# Patient Record
Sex: Female | Born: 1975 | Race: Black or African American | Hispanic: No | Marital: Single | Smoking: Light tobacco smoker
Health system: Southern US, Community
[De-identification: ages and names within clinical notes are randomized; demographics above are authoritative.]

## PROBLEM LIST (undated history)

## (undated) DIAGNOSIS — M1990 Unspecified osteoarthritis, unspecified site: Secondary | ICD-10-CM

## (undated) DIAGNOSIS — K219 Gastro-esophageal reflux disease without esophagitis: Secondary | ICD-10-CM

## (undated) DIAGNOSIS — I10 Essential (primary) hypertension: Secondary | ICD-10-CM

## (undated) DIAGNOSIS — F32A Depression, unspecified: Secondary | ICD-10-CM

## (undated) DIAGNOSIS — F419 Anxiety disorder, unspecified: Secondary | ICD-10-CM

## (undated) HISTORY — PX: CHOLECYSTECTOMY: SHX55

---

## 2019-05-16 ENCOUNTER — Other Ambulatory Visit: Payer: Self-pay

## 2019-05-16 ENCOUNTER — Ambulatory Visit (HOSPITAL_COMMUNITY)
Admission: EM | Admit: 2019-05-16 | Discharge: 2019-05-16 | Disposition: A | Discharge status: Home / Self Care | Payer: Medicaid Other | Attending: Internal Medicine | Admitting: Internal Medicine

## 2019-05-16 ENCOUNTER — Encounter (HOSPITAL_COMMUNITY): Payer: Self-pay | Admitting: Emergency Medicine

## 2019-05-16 ENCOUNTER — Ambulatory Visit (INDEPENDENT_AMBULATORY_CARE_PROVIDER_SITE_OTHER): Payer: Medicaid Other

## 2019-05-16 DIAGNOSIS — Z20828 Contact with and (suspected) exposure to other viral communicable diseases: Secondary | ICD-10-CM | POA: Diagnosis present

## 2019-05-16 DIAGNOSIS — R0602 Shortness of breath: Secondary | ICD-10-CM

## 2019-05-16 DIAGNOSIS — R110 Nausea: Secondary | ICD-10-CM

## 2019-05-16 DIAGNOSIS — R519 Headache, unspecified: Secondary | ICD-10-CM | POA: Diagnosis not present

## 2019-05-16 DIAGNOSIS — J029 Acute pharyngitis, unspecified: Secondary | ICD-10-CM

## 2019-05-16 DIAGNOSIS — Z20822 Contact with and (suspected) exposure to covid-19: Secondary | ICD-10-CM

## 2019-05-16 MED ORDER — ONDANSETRON HCL 4 MG PO TABS: 4.0000 mg | ORAL_TABLET | Freq: Four times a day (QID) | ORAL | 0 refills | Status: DC

## 2019-05-16 MED ORDER — BENZONATATE 100 MG PO CAPS: 100.0000 mg | ORAL_CAPSULE | Freq: Three times a day (TID) | ORAL | 0 refills | Status: DC

## 2019-05-16 MED ORDER — CETIRIZINE-PSEUDOEPHEDRINE ER 5-120 MG PO TB12: 1.0000 | ORAL_TABLET | Freq: Every day | ORAL | 0 refills | Status: DC

## 2019-05-16 MED ORDER — ALBUTEROL SULFATE HFA 108 (90 BASE) MCG/ACT IN AERS: 1.0000 | INHALATION_SPRAY | Freq: Four times a day (QID) | RESPIRATORY_TRACT | 0 refills | Status: DC | PRN

## 2019-05-16 NOTE — ED Triage Notes (Signed)
Has had 2 COVID tests in 2 days, both negative. (At [facility], had a rapid 30 minutes ago at work)   PT reports sore throat, SOB, vomiting, cough for 2 days.  Fatigued, feels "horrible"

## 2019-05-16 NOTE — ED Provider Notes (Signed)
[facility]    CSN: [id number] Arrival date & time: 05/16/19  1527      History   Chief Complaint Chief Complaint  Patient presents with  . Sore Throat  . Emesis    HPI Gina Patel is a 43 y.o. female.   The history is provided by the patient. No language interpreter was used.  Sore Throat This is a new problem. The current episode started 2 days ago. The problem occurs constantly. The problem has been gradually worsening. Associated symptoms include headaches and shortness of breath. Pertinent negatives include no chest pain and no abdominal pain. Nothing aggravates the symptoms. The symptoms are relieved by NSAIDs. She has tried acetaminophen for the symptoms. The treatment provided no relief.  Emesis Severity:  Mild Timing:  Rare Quality:  Unable to specify Able to tolerate:  Liquids How soon after eating does vomiting occur:  15 minutes Chronicity:  New Recent urination:  Normal Relieved by:  Nothing Worsened by:  Nothing Associated symptoms: cough, headaches and sore throat   Associated symptoms: no abdominal pain, no chills and no fever     History reviewed. No pertinent past medical history.  There are no active problems to display for this patient.   Past Surgical History:  Procedure Laterality Date  . CHOLECYSTECTOMY      OB History   No obstetric history on file.      Home Medications    Prior to Admission medications   Medication Sig Start Date End Date Taking? Authorizing Provider  albuterol (VENTOLIN HFA) 108 (90 Base) MCG/ACT inhaler Inhale 1-2 puffs into the lungs every 6 (six) hours as needed for wheezing or shortness of breath. 05/16/19   Audianna Landgren, Zachery DakinsKomlanvi S, FNP  benzonatate (TESSALON) 100 MG capsule Take 1 capsule (100 mg total) by mouth every 8 (eight) hours. 05/16/19   Scheryl Sanborn, Zachery DakinsKomlanvi S, FNP  cetirizine-pseudoephedrine (ZYRTEC-D) 5-120 MG tablet Take 1 tablet by mouth daily. 05/16/19   Haward Pope, Zachery DakinsKomlanvi S, FNP   ondansetron (ZOFRAN) 4 MG tablet Take 1 tablet (4 mg total) by mouth every 6 (six) hours. 05/16/19   AvegnoZachery Dakins, Chenita Ruda S, FNP    Family History No family history on file.  Social History Social History   Tobacco Use  . Smoking status: Not on file  Substance Use Topics  . Alcohol use: Not on file  . Drug use: Not on file     Allergies   Patient has no known allergies.   Review of Systems Review of Systems  Constitutional: Negative for activity change, appetite change, chills, fatigue and fever.  HENT: Positive for sinus pressure, sinus pain and sore throat.   Respiratory: Positive for cough and shortness of breath.   Cardiovascular: Negative for chest pain.  Gastrointestinal: Positive for vomiting. Negative for abdominal pain and nausea.  Neurological: Positive for headaches. Negative for dizziness.  ROS: all other are negatives   Physical Exam Triage Vital Signs ED Triage Vitals  Enc Vitals Group     BP 05/16/19 1603 (!) 150/106     Pulse Rate 05/16/19 1557 90     Resp 05/16/19 1557 20     Temp 05/16/19 1557 98.8 F (37.1 C)     Temp Source 05/16/19 1557 Oral     SpO2 05/16/19 1557 98 %     Weight --      Height --      Head Circumference --      Peak Flow --      Pain  Score 05/16/19 1555 5     Pain Loc --      Pain Edu? --      Excl. in [place]? --    No data found.  Updated Vital Signs BP (!) 150/106   Pulse 90   Temp 98.8 F (37.1 C) (Oral)   Resp 20   LMP 04/25/2019   SpO2 98%   Visual Acuity Right Eye Distance:   Left Eye Distance:   Bilateral Distance:    Right Eye Near:   Left Eye Near:    Bilateral Near:     Physical Exam Vitals signs and nursing note reviewed.  Constitutional:      General: She is not in acute distress.    Appearance: She is well-developed. She is obese. She is not ill-appearing or toxic-appearing.  HENT:     Head: Normocephalic.     Right Ear: Tympanic membrane, ear canal and external ear normal.     Left Ear:  Tympanic membrane, ear canal and external ear normal.     Nose: No congestion.     Right Sinus: Maxillary sinus tenderness present.     Left Sinus: Maxillary sinus tenderness present.     Mouth/Throat:     Mouth: Mucous membranes are moist.     Pharynx: Uvula midline. No oropharyngeal exudate.     Tonsils: No tonsillar exudate or tonsillar abscesses. 2+ on the right. 2+ on the left.  Cardiovascular:     Rate and Rhythm: Normal rate and regular rhythm.     Pulses: Normal pulses.     Heart sounds: Normal heart sounds. No murmur.  Pulmonary:     Effort: Pulmonary effort is normal. No respiratory distress.     Breath sounds: Normal breath sounds. No wheezing or rhonchi.  Chest:     Chest wall: No tenderness.  Abdominal:     General: Abdomen is flat. Bowel sounds are normal.     Palpations: Abdomen is soft.  Neurological:     Mental Status: She is alert and oriented to person, place, and time.      UC Treatments / Results  Labs (all labs ordered are listed, but only abnormal results are displayed) Labs Reviewed  NOVEL CORONAVIRUS, NAA (HOSP ORDER, SEND-OUT TO REF LAB; TAT 18-24 HRS)    EKG   Radiology Dg Chest 2 View  Result Date: 05/16/2019 CLINICAL DATA:  Cough, shortness of breath EXAM: CHEST - 2 VIEW COMPARISON:  None. FINDINGS: The heart size and mediastinal contours are within normal limits. Both lungs are clear. No pleural effusion. The visualized skeletal structures are unremarkable. IMPRESSION: No acute process in the chest. Electronically Signed   By: Macy Mis M.D.   On: 05/16/2019 16:44    Procedures Procedures (including critical care time)  Medications Ordered in UC Medications - No data to display  Initial Impression / Assessment and Plan / UC Course  I have reviewed the triage vital signs and the nursing notes.  Pertinent labs & imaging results that were available during my care of the patient were reviewed by me and considered in my medical  decision making (see chart for details).    COVID-19 test was ordered for this patient as her symptom arte most consistent with COVID. Chest X ray was negative for cardiopulmonary disease. Zofran, Albuterol, tessalon and zyrtec were prescribed and patient was advised to follow up if symptom get worse or go to ED Final Clinical Impressions(s) / UC Diagnoses   Final diagnoses:  Suspected  COVID-19 virus infection     Discharge Instructions     COVID testing ordered.  It will take between 2-7 days for test results.  Someone will contact you regarding abnormal results.    In the meantime: You should remain isolated in your home for 10 days from symptom onset AND greater than 72 hours after symptoms resolution (absence of fever without the use of fever-reducing medication and improvement in respiratory symptoms), whichever is longer Get plenty of rest and push fluids Tessalon Perles prescribed for cough Zyrtec-D prescribed for nasal congestion, runny nose, and/or sore throat Flonase prescribed for nasal congestion and runny nose Use medications daily for symptom relief Use OTC medications like ibuprofen or tylenol as needed fever or pain Call or go to the ED if you have any new or worsening symptoms such as fever, worsening cough, shortness of breath, chest tightness, chest pain, turning blue, changes in mental status, etc...    ED Prescriptions    Medication Sig Dispense Auth. Provider   albuterol (VENTOLIN HFA) 108 (90 Base) MCG/ACT inhaler Inhale 1-2 puffs into the lungs every 6 (six) hours as needed for wheezing or shortness of breath. 8 g Dwanda Tufano, Zachery Dakins, FNP   benzonatate (TESSALON) 100 MG capsule Take 1 capsule (100 mg total) by mouth every 8 (eight) hours. 21 capsule Lesieli Bresee S, FNP   cetirizine-pseudoephedrine (ZYRTEC-D) 5-120 MG tablet Take 1 tablet by mouth daily. 30 tablet Kiandre Spagnolo S, FNP   ondansetron (ZOFRAN) 4 MG tablet Take 1 tablet (4 mg total) by mouth  every 6 (six) hours. 12 tablet Anabell Swint, Zachery Dakins, FNP     PDMP not reviewed this encounter.   Durward Parcel, FNP 05/16/19 1746

## 2019-05-16 NOTE — Discharge Instructions (Addendum)
Your Chest X-ray show no cardioabnormalities  COVID testing ordered.  It will take between 2-7 days for test results.  Someone will contact you regarding abnormal results.    In the meantime: You should remain isolated in your home for 10 days from symptom onset AND greater than 72 hours after symptoms resolution (absence of fever without the use of fever-reducing medication and improvement in respiratory symptoms), whichever is longer Get plenty of rest and push fluids Tessalon Perles prescribed for cough Zyrtec-D prescribed for nasal congestion, runny nose, and/or sore throat Flonase prescribed for nasal congestion and runny nose Use medications daily for symptom relief Use OTC medications like ibuprofen or tylenol as needed fever or pain Call or go to the ED if you have any new or worsening symptoms such as fever, worsening cough, shortness of breath, chest tightness, chest pain, turning blue, changes in mental status, etc..Marland Kitchen

## 2019-05-18 LAB — NOVEL CORONAVIRUS, NAA (HOSP ORDER, SEND-OUT TO REF LAB; TAT 18-24 HRS): SARS-CoV-2, NAA: NOT DETECTED

## 2019-05-21 ENCOUNTER — Telehealth (HOSPITAL_COMMUNITY): Payer: Self-pay | Admitting: Emergency Medicine

## 2019-05-21 NOTE — Telephone Encounter (Signed)
Pt called requesting covid test results, results reported as negative.  

## 2019-06-12 ENCOUNTER — Other Ambulatory Visit: Payer: Self-pay

## 2019-06-12 ENCOUNTER — Emergency Department (HOSPITAL_COMMUNITY)
Admission: EM | Admit: 2019-06-12 | Discharge: 2019-06-12 | Disposition: A | Discharge status: Home / Self Care | Payer: Worker's Compensation | Attending: Emergency Medicine | Admitting: Emergency Medicine

## 2019-06-12 ENCOUNTER — Encounter (HOSPITAL_COMMUNITY): Payer: Self-pay | Admitting: Emergency Medicine

## 2019-06-12 DIAGNOSIS — Z3202 Encounter for pregnancy test, result negative: Secondary | ICD-10-CM | POA: Diagnosis not present

## 2019-06-12 DIAGNOSIS — Y9289 Other specified places as the place of occurrence of the external cause: Secondary | ICD-10-CM | POA: Insufficient documentation

## 2019-06-12 DIAGNOSIS — Y990 Civilian activity done for income or pay: Secondary | ICD-10-CM | POA: Insufficient documentation

## 2019-06-12 DIAGNOSIS — Z79899 Other long term (current) drug therapy: Secondary | ICD-10-CM | POA: Insufficient documentation

## 2019-06-12 DIAGNOSIS — X500XXA Overexertion from strenuous movement or load, initial encounter: Secondary | ICD-10-CM | POA: Insufficient documentation

## 2019-06-12 DIAGNOSIS — S3992XA Unspecified injury of lower back, initial encounter: Secondary | ICD-10-CM | POA: Diagnosis present

## 2019-06-12 DIAGNOSIS — S39012A Strain of muscle, fascia and tendon of lower back, initial encounter: Secondary | ICD-10-CM | POA: Diagnosis not present

## 2019-06-12 DIAGNOSIS — Y9389 Activity, other specified: Secondary | ICD-10-CM | POA: Insufficient documentation

## 2019-06-12 LAB — POC URINE PREG, ED: Preg Test, Ur: NEGATIVE

## 2019-06-12 MED ORDER — METHYLPREDNISOLONE 4 MG PO TBPK: ORAL_TABLET | ORAL | 0 refills | Status: DC

## 2019-06-12 MED ORDER — OXYCODONE-ACETAMINOPHEN 5-325 MG PO TABS
1.0000 | ORAL_TABLET | ORAL | Status: DC | PRN
  Administered 2019-06-12: 1 via ORAL
  Filled 2019-06-12: qty 1

## 2019-06-12 MED ORDER — CYCLOBENZAPRINE HCL 10 MG PO TABS: 10.0000 mg | ORAL_TABLET | Freq: Two times a day (BID) | ORAL | 0 refills | Status: DC | PRN

## 2019-06-12 NOTE — ED Provider Notes (Signed)
[facility] Provider Note   CSN: [id number] Arrival date & time: 06/12/19  0133     History Chief Complaint  Patient presents with  . Back Pain    Carolan Avedisian is a 43 y.o. female.  The history is provided by the patient.  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 No pertinent past medical history.  There are no problems to display for this patient.   Past Surgical History:  Procedure Laterality Date  . CHOLECYSTECTOMY       OB History   No obstetric history on file.     No family history on file.  Social History   Tobacco Use  . Smoking status: Not on file  Substance Use Topics  . Alcohol use: Not on file  . Drug use: Not on file    Home Medications Prior to Admission medications   Medication Sig Start Date End Date Taking? Authorizing Provider  albuterol (VENTOLIN HFA) 108 (90 Base) MCG/ACT inhaler Inhale 1-2 puffs into the lungs every 6 (six) hours as needed for wheezing or shortness of breath. 05/16/19   Avegno, Darrelyn Hillock, FNP  benzonatate (TESSALON) 100 MG capsule Take 1 capsule (100 mg total) by mouth every 8 (eight) hours. 05/16/19   Avegno, Darrelyn Hillock, FNP  cetirizine-pseudoephedrine (ZYRTEC-D) 5-120 MG tablet Take 1 tablet by mouth  daily. 05/16/19   Avegno, Darrelyn Hillock, FNP  cyclobenzaprine (FLEXERIL) 10 MG tablet Take 1 tablet (10 mg total) by mouth 2 (two) times daily as needed for up to 20 doses for muscle spasms. 06/12/19   Cheyan Frees, DO  methylPREDNISolone (MEDROL DOSEPAK) 4 MG TBPK tablet Follow package insert 06/12/19   Lakiah Dhingra, DO  ondansetron (ZOFRAN) 4 MG tablet Take 1 tablet (4 mg total) by mouth every 6 (six) hours. 05/16/19   AvegnoDarrelyn Hillock, FNP    Allergies    Patient has no known allergies.  Review of Systems   Review of Systems  Constitutional: Negative for chills, fever and weight loss.  HENT: Negative for ear pain and sore throat.   Eyes: Negative for pain and visual disturbance.  Respiratory: Negative for cough and shortness of breath.   Cardiovascular: Negative for chest pain and palpitations.  Gastrointestinal: Negative for abdominal pain, bowel incontinence and vomiting.  Genitourinary: Negative for dysuria, hematuria and pelvic pain.  Musculoskeletal: Positive for back pain and gait problem. Negative for arthralgias.  Skin: Negative for color change and rash.  Neurological: Positive for tingling. Negative for seizures, syncope, weakness, numbness and headaches.  All other systems reviewed and are negative.   Physical Exam Updated Vital Signs  ED Triage Vitals  Enc Vitals Group     BP 06/12/19 0158 (!) 147/95     Pulse Rate 06/12/19 0158 93  Resp 06/12/19 0158 16     Temp 06/12/19 0158 99.3 F (37.4 C)     Temp Source 06/12/19 0158 Oral     SpO2 06/12/19 0158 99 %     Weight 06/12/19 0209 220 lb (99.8 kg)     Height 06/12/19 0209 5\' 1"  (1.549 m)     Head Circumference --      Peak Flow --      Pain Score 06/12/19 0208 10     Pain Loc --      Pain Edu? --      Excl. in [place]? --     Physical Exam Vitals and nursing note reviewed.  Constitutional:      General: She is not in acute distress.    Appearance: She is well-developed.  HENT:     Head: Normocephalic  and atraumatic.     Nose: Nose normal.     Mouth/Throat:     Mouth: Mucous membranes are moist.  Eyes:     Extraocular Movements: Extraocular movements intact.     Conjunctiva/sclera: Conjunctivae normal.     Pupils: Pupils are equal, round, and reactive to light.  Cardiovascular:     Rate and Rhythm: Normal rate and regular rhythm.     Pulses: Normal pulses.     Heart sounds: Normal heart sounds. No murmur.  Pulmonary:     Effort: Pulmonary effort is normal. No respiratory distress.     Breath sounds: Normal breath sounds.  Abdominal:     Palpations: Abdomen is soft.     Tenderness: There is no abdominal tenderness.  Musculoskeletal:        General: Tenderness present. Normal range of motion.     Cervical back: Neck supple.     Comments: No midline spinal tenderness, paraspinal lumbar muscle tenderness, tenderness in left gluteal area  Skin:    General: Skin is warm and dry.  Neurological:     General: No focal deficit present.     Mental Status: She is alert and oriented to person, place, and time.     Cranial Nerves: No cranial nerve deficit.     Sensory: No sensory deficit.     Motor: No weakness.     Coordination: Coordination normal.     Comments: Antalgic gait, 5+ out of 5 strength throughout, normal sensation     ED Results / Procedures / Treatments   Labs (all labs ordered are listed, but only abnormal results are displayed) Labs Reviewed  POC URINE PREG, ED    EKG None  Radiology No results found.  Procedures Procedures (including critical care time)  Medications Ordered in ED Medications  oxyCODONE-acetaminophen (PERCOCET/ROXICET) 5-325 MG per tablet 1 tablet (1 tablet Oral Given 06/12/19 0242)    ED Course  I have reviewed the triage vital signs and the nursing notes.  Pertinent labs & imaging results that were available during my care of the patient were reviewed by me and considered in my medical decision making (see chart for details).     MDM Rules/Calculators/A&P                      Ardelia MemsKimberly Efferson is a 43 year old female with history of hypertension who presents to the ED with low back pain.  Patient with normal vitals.  No fever.  No concern for cauda equina, epidural abscess.  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  History and physical is consistent with a muscle spasm.  Likely sciatica.  No midline spinal tenderness.  No red flag signs including fever, loss of bowel or bladder.  Patient given Norco.  She is able to ambulate with antalgic gait.  Will prescribe Motrin, Tylenol, Flexeril, Dosepak of steroids.  Recommend follow-up with primary care doctor.  May benefit from physical therapy.  Given return precautions.  This chart was dictated using voice recognition software.  Despite best efforts to proofread,  errors can occur which can change the documentation meaning.     Final Clinical Impression(s) / ED Diagnoses Final diagnoses:  Strain of lumbar region, initial encounter    Rx / DC Orders ED Discharge Orders         Ordered    methylPREDNISolone (MEDROL DOSEPAK) 4 MG TBPK tablet     06/12/19 0809    cyclobenzaprine (FLEXERIL) 10 MG tablet  2 times daily PRN     06/12/19 0809           Virgina Norfolk, DO 06/12/19 [phone]

## 2019-06-12 NOTE — ED Triage Notes (Signed)
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  Pt states pain is going down her left leg.

## 2019-06-12 NOTE — ED Notes (Signed)
Called in waiting room and pt did not answer.

## 2019-06-12 NOTE — Discharge Instructions (Addendum)
Take 800 mg of Motrin every 8 hours for the next 5 days, take 1000 mg of Tylenol 4 times a day for the next 5 days, take steroids as prescribed, use Flexeril as needed for muscle spasm but do not mix with alcohol or drugs or operate heavy machinery, follow-up with your primary care doctor

## 2019-06-18 ENCOUNTER — Other Ambulatory Visit: Payer: Self-pay

## 2019-06-18 ENCOUNTER — Other Ambulatory Visit: Payer: Self-pay | Admitting: Family Medicine

## 2019-06-18 ENCOUNTER — Ambulatory Visit: Payer: Self-pay

## 2019-06-18 DIAGNOSIS — M545 Low back pain, unspecified: Secondary | ICD-10-CM

## 2019-10-09 ENCOUNTER — Encounter (HOSPITAL_COMMUNITY): Payer: Self-pay

## 2019-10-09 ENCOUNTER — Other Ambulatory Visit: Payer: Self-pay

## 2019-10-09 ENCOUNTER — Ambulatory Visit (INDEPENDENT_AMBULATORY_CARE_PROVIDER_SITE_OTHER): Payer: Medicaid Other

## 2019-10-09 ENCOUNTER — Ambulatory Visit (HOSPITAL_COMMUNITY)
Admission: EM | Admit: 2019-10-09 | Discharge: 2019-10-09 | Disposition: A | Discharge status: Home / Self Care | Payer: Medicaid Other | Attending: Emergency Medicine | Admitting: Emergency Medicine

## 2019-10-09 DIAGNOSIS — F17210 Nicotine dependence, cigarettes, uncomplicated: Secondary | ICD-10-CM | POA: Insufficient documentation

## 2019-10-09 DIAGNOSIS — R0789 Other chest pain: Secondary | ICD-10-CM

## 2019-10-09 DIAGNOSIS — R0981 Nasal congestion: Secondary | ICD-10-CM | POA: Insufficient documentation

## 2019-10-09 DIAGNOSIS — Z20822 Contact with and (suspected) exposure to covid-19: Secondary | ICD-10-CM | POA: Diagnosis not present

## 2019-10-09 DIAGNOSIS — J069 Acute upper respiratory infection, unspecified: Secondary | ICD-10-CM | POA: Diagnosis present

## 2019-10-09 DIAGNOSIS — Z79899 Other long term (current) drug therapy: Secondary | ICD-10-CM | POA: Diagnosis not present

## 2019-10-09 DIAGNOSIS — R05 Cough: Secondary | ICD-10-CM | POA: Insufficient documentation

## 2019-10-09 DIAGNOSIS — H6121 Impacted cerumen, right ear: Secondary | ICD-10-CM | POA: Diagnosis not present

## 2019-10-09 DIAGNOSIS — H9201 Otalgia, right ear: Secondary | ICD-10-CM | POA: Diagnosis not present

## 2019-10-09 DIAGNOSIS — R519 Headache, unspecified: Secondary | ICD-10-CM | POA: Insufficient documentation

## 2019-10-09 MED ORDER — PROMETHAZINE-DM 6.25-15 MG/5ML PO SYRP: 5.0000 mL | ORAL_SOLUTION | Freq: Every evening | ORAL | 0 refills | Status: DC | PRN

## 2019-10-09 MED ORDER — BENZONATATE 100 MG PO CAPS: 100.0000 mg | ORAL_CAPSULE | Freq: Every evening | ORAL | 0 refills | Status: DC | PRN

## 2019-10-09 MED ORDER — PSEUDOEPHEDRINE HCL 60 MG PO TABS: 60.0000 mg | ORAL_TABLET | Freq: Three times a day (TID) | ORAL | 0 refills | Status: DC | PRN

## 2019-10-09 NOTE — Discharge Instructions (Addendum)

## 2019-10-09 NOTE — ED Triage Notes (Signed)
Pt presents with productive cough, nasal drainage, headache, sore throat, and bilateral ear pain X 4 days.

## 2019-10-09 NOTE — ED Provider Notes (Signed)
[facility]   MRN: [id number] DOB: 02/19/1976  Subjective:   Gina Patel is a 44 y.o. female presenting for 3-day history of acute onset moderate persistent worsening malaise.  Patient has used Aleve, over-the-counter allergy medicine yesterday only.  She is working on quitting smoking, is down to 2 cigarettes/day.  Denies history of asthma, COPD.  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  No current facility-administered medications for this encounter.  Current Outpatient Medications:  .  albuterol (VENTOLIN HFA) 108 (90 Base) MCG/ACT inhaler, Inhale 1-2 puffs into the lungs every 6 (six) hours as needed for wheezing or shortness of breath., Disp: 8 g, Rfl: 0 .  benzonatate (TESSALON) 100 MG capsule, Take 1 capsule (100 mg total) by mouth every 8 (eight) hours., Disp: 21 capsule, Rfl: 0 .  cetirizine-pseudoephedrine (ZYRTEC-D) 5-120 MG tablet, Take 1 tablet by mouth daily., Disp: 30 tablet, Rfl: 0 .  cyclobenzaprine (FLEXERIL) 10 MG tablet, Take 1 tablet (10 mg total) by mouth 2 (two) times daily as needed for up to 20 doses for muscle spasms., Disp: 20 tablet, Rfl: 0 .  methylPREDNISolone (MEDROL DOSEPAK) 4 MG TBPK tablet, Follow package insert, Disp: 21 each, Rfl: 0 .  ondansetron (ZOFRAN) 4 MG tablet, Take 1 tablet (4 mg total) by mouth every 6 (six) hours., Disp: 12 tablet, Rfl: 0   No Known Allergies  History reviewed. No pertinent past medical history.   Past Surgical History:  Procedure Laterality Date  . CHOLECYSTECTOMY      Family History  Family history unknown: Yes    Social History   Tobacco Use  . Smoking status: Light Tobacco Smoker    Types: Cigarettes  Substance Use Topics  . Alcohol use: Not on file  . Drug use: Not on file    Review of Systems  Constitutional: Positive for fever and malaise/fatigue.  HENT: Positive for congestion, ear discharge and ear pain. Negative for sinus pain and sore throat.   Eyes:  Negative for discharge and redness.  Respiratory: Positive for cough. Negative for hemoptysis, shortness of breath and wheezing.   Cardiovascular: Positive for chest pain.  Gastrointestinal: Negative for abdominal pain, diarrhea, nausea and vomiting.  Genitourinary: Negative for dysuria, flank pain and hematuria.  Musculoskeletal: Negative for myalgias.  Skin: Negative for rash.  Neurological: Positive for headaches. Negative for dizziness and weakness.  Psychiatric/Behavioral: Negative for depression and substance abuse.     Objective:   Vitals: BP 116/86 (BP Location: Right Arm)   Pulse 94   Temp 98 F (36.7 C) (Oral)   Resp 17   LMP 09/21/2019   SpO2 96%   Physical Exam Constitutional:      General: She is not in acute distress.    Appearance: Normal appearance. She is well-developed. She is ill-appearing. She is not toxic-appearing or diaphoretic.  HENT:     Head: Normocephalic and atraumatic.     Right Ear: Tympanic membrane and ear canal normal. No drainage or tenderness. No middle ear effusion. Tympanic membrane is not erythematous.     Left Ear: Tympanic membrane and ear canal normal. No drainage or tenderness.  No middle ear effusion. Tympanic membrane is not erythematous.     Ears:     Comments: Ceruminous right ear canal.    Nose: Congestion present. No rhinorrhea.     Mouth/Throat:     Mouth: Mucous membranes are moist. No oral lesions.     Pharynx: No pharyngeal swelling, oropharyngeal  exudate, posterior oropharyngeal erythema or uvula swelling.     Tonsils: No tonsillar exudate or tonsillar abscesses.  Eyes:     General: No scleral icterus.       Right eye: No discharge.        Left eye: No discharge.     Extraocular Movements: Extraocular movements intact.     Right eye: Normal extraocular motion.     Left eye: Normal extraocular motion.     Conjunctiva/sclera: Conjunctivae normal.     Pupils: Pupils are equal, round, and reactive to light.   Cardiovascular:     Rate and Rhythm: Normal rate and regular rhythm.     Pulses: Normal pulses.     Heart sounds: Normal heart sounds. No murmur. No friction rub. No gallop.   Pulmonary:     Effort: Pulmonary effort is normal. No respiratory distress.     Breath sounds: No stridor. No wheezing, rhonchi or rales.     Comments: Decreased lung sounds in mid to lower lung fields. Musculoskeletal:     Cervical back: Normal range of motion and neck supple.  Lymphadenopathy:     Cervical: No cervical adenopathy.  Skin:    General: Skin is warm and dry.     Findings: No rash.  Neurological:     General: No focal deficit present.     Mental Status: She is alert and oriented to person, place, and time.     Cranial Nerves: No cranial nerve deficit.     Motor: No weakness.     Coordination: Coordination normal.     Gait: Gait normal.     Deep Tendon Reflexes: Reflexes normal.  Psychiatric:        Mood and Affect: Mood normal.        Behavior: Behavior normal.        Thought Content: Thought content normal.        Judgment: Judgment normal.    DG Chest 2 View  Result Date: 10/09/2019 CLINICAL DATA:  Productive cough with chest pain EXAM: CHEST - 2 VIEW COMPARISON:  05/16/2019 FINDINGS: Cardiac shadow is within normal limits. The lungs are well aerated bilaterally. No focal infiltrate or sizable effusion is seen. No acute bony abnormality is noted. IMPRESSION: No active cardiopulmonary disease. Electronically Signed   By: Alcide Clever M.D.   On: 10/09/2019 15:48    Assessment and Plan :   1. Atypical chest pain   2. Viral URI with cough   3. Sinus congestion   4. Sinus headache   5. Right ear pain   6. Excessive cerumen in ear canal, right     Will manage for viral illness such as viral URI, viral syndrome, viral rhinitis, COVID-19. Counseled patient on nature of COVID-19 including modes of transmission, diagnostic testing, management and supportive care.  Offered symptomatic relief.  COVID 19 testing is pending. Counseled patient on potential for adverse effects with medications prescribed/recommended today, ER and return-to-clinic precautions discussed, patient verbalized understanding.     Wallis Bamberg, [place] 10/09/19 1612

## 2019-10-10 LAB — SARS CORONAVIRUS 2 (TAT 6-24 HRS): SARS Coronavirus 2: NEGATIVE

## 2020-04-30 ENCOUNTER — Emergency Department (HOSPITAL_COMMUNITY)
Admission: EM | Admit: 2020-04-30 | Discharge: 2020-05-01 | Disposition: A | Discharge status: Home / Self Care | Payer: Medicaid Other | Attending: Emergency Medicine | Admitting: Emergency Medicine

## 2020-04-30 ENCOUNTER — Other Ambulatory Visit: Payer: Self-pay

## 2020-04-30 ENCOUNTER — Encounter (HOSPITAL_COMMUNITY): Payer: Self-pay | Admitting: *Deleted

## 2020-04-30 DIAGNOSIS — F17210 Nicotine dependence, cigarettes, uncomplicated: Secondary | ICD-10-CM | POA: Diagnosis not present

## 2020-04-30 DIAGNOSIS — R519 Headache, unspecified: Secondary | ICD-10-CM | POA: Diagnosis present

## 2020-04-30 DIAGNOSIS — Z79899 Other long term (current) drug therapy: Secondary | ICD-10-CM | POA: Diagnosis not present

## 2020-04-30 DIAGNOSIS — I10 Essential (primary) hypertension: Secondary | ICD-10-CM | POA: Diagnosis not present

## 2020-04-30 HISTORY — DX: Essential (primary) hypertension: I10

## 2020-04-30 NOTE — ED Triage Notes (Signed)
Pt reports headache for 3 days. She took her bp and it was 170/111 tonight. She says she was taken off her bp medications several years ago. Taking tylenol without relief.

## 2020-05-01 ENCOUNTER — Emergency Department (HOSPITAL_COMMUNITY): Payer: Medicaid Other

## 2020-05-01 LAB — I-STAT BETA HCG BLOOD, ED (MC, WL, AP ONLY): I-stat hCG, quantitative: <5 m[IU]/mL (ref <5)

## 2020-05-01 MED ORDER — DIPHENHYDRAMINE HCL 12.5 MG/5ML PO ELIX
12.5000 mg | ORAL_SOLUTION | Freq: Once | ORAL | Status: AC
  Administered 2020-05-01: 12.5 mg via ORAL
  Filled 2020-05-01: qty 10

## 2020-05-01 MED ORDER — NAPROXEN 375 MG PO TABS: 375.0000 mg | ORAL_TABLET | Freq: Two times a day (BID) | ORAL | 0 refills | Status: DC

## 2020-05-01 MED ORDER — METOCLOPRAMIDE HCL 10 MG PO TABS
10.0000 mg | ORAL_TABLET | Freq: Once | ORAL | Status: AC
  Administered 2020-05-01: 10 mg via ORAL
  Filled 2020-05-01: qty 1

## 2020-05-01 MED ORDER — KETOROLAC TROMETHAMINE 60 MG/2ML IM SOLN
60.0000 mg | Freq: Once | INTRAMUSCULAR | Status: AC
  Administered 2020-05-01: 60 mg via INTRAMUSCULAR
  Filled 2020-05-01: qty 2

## 2020-05-01 MED ORDER — DIVALPROEX SODIUM 250 MG PO DR TAB
500.0000 mg | DELAYED_RELEASE_TABLET | Freq: Two times a day (BID) | ORAL | Status: DC
  Administered 2020-05-01: 500 mg via ORAL
  Filled 2020-05-01: qty 2

## 2020-05-01 NOTE — ED Provider Notes (Signed)
[facility] EMERGENCY DEPARTMENT Provider Note   CSN: [id number] Arrival date & time: 04/30/20  2238     History Chief Complaint  Patient presents with  . Headache    Gina Patel is a 44 y.o. female.  The history is provided by the patient.  Headache Pain location:  Frontal Quality:  Dull Radiates to:  Does not radiate Onset quality:  Gradual Timing:  Constant Chronicity:  New Context: not activity, not exposure to bright light, not caffeine, not coughing, not defecating, not eating, not stress, not exposure to cold air, not intercourse, not loud noise and not straining   Relieved by:  Nothing Worsened by:  Nothing Ineffective treatments:  Acetaminophen Associated symptoms: no abdominal pain, no back pain, no blurred vision, no congestion, no cough, no diarrhea, no dizziness, no drainage, no ear pain, no eye pain, no facial pain, no fatigue, no fever, no focal weakness, no hearing loss, no loss of balance, no myalgias, no nausea, no near-syncope, no neck pain, no neck stiffness, no numbness, no paresthesias, no photophobia, no seizures, no sinus pressure, no sore throat, no swollen glands, no syncope, no tingling, no URI, no visual change, no vomiting and no weakness   Risk factors: no anger   Patient with HTN not currently on medication presents with 3.5 days of frontal HA.  No f/c/r.  No changes in vision, speech or cognition.  No pain or loss of eye motion.,       Past Medical History:  Diagnosis Date  . Hypertension     There are no problems to display for this patient.   Past Surgical History:  Procedure Laterality Date  . CHOLECYSTECTOMY       OB History   No obstetric history on file.     Family History  Family history unknown: Yes    Social History   Tobacco Use  . Smoking status: Light Tobacco Smoker    Types: Cigarettes  Substance Use Topics  . Alcohol use: Not on file  . Drug use: Not on file    Home  Medications Prior to Admission medications   Medication Sig Start Date End Date Taking? Authorizing Provider  albuterol (VENTOLIN HFA) 108 (90 Base) MCG/ACT inhaler Inhale 1-2 puffs into the lungs every 6 (six) hours as needed for wheezing or shortness of breath. 05/16/19   Avegno, Zachery Dakins, FNP  benzonatate (TESSALON) 100 MG capsule Take 1-2 capsules (100-200 mg total) by mouth at bedtime as needed for cough. 10/09/19   Wallis Bamberg, PA-C  cetirizine-pseudoephedrine (ZYRTEC-D) 5-120 MG tablet Take 1 tablet by mouth daily. 05/16/19   Avegno, Zachery Dakins, FNP  cyclobenzaprine (FLEXERIL) 10 MG tablet Take 1 tablet (10 mg total) by mouth 2 (two) times daily as needed for up to 20 doses for muscle spasms. 06/12/19   Curatolo, Adam, DO  methylPREDNISolone (MEDROL DOSEPAK) 4 MG TBPK tablet Follow package insert 06/12/19   Curatolo, Adam, DO  ondansetron (ZOFRAN) 4 MG tablet Take 1 tablet (4 mg total) by mouth every 6 (six) hours. 05/16/19   Avegno, Zachery Dakins, FNP  promethazine-dextromethorphan (PROMETHAZINE-DM) 6.25-15 MG/5ML syrup Take 5 mLs by mouth at bedtime as needed for cough. 10/09/19   Wallis Bamberg, PA-C  pseudoephedrine (SUDAFED) 60 MG tablet Take 1 tablet (60 mg total) by mouth every 8 (eight) hours as needed for congestion. 10/09/19   Wallis Bamberg, PA-C    Allergies    Patient has no known allergies.  Review of Systems   Review  of Systems  Constitutional: Negative for fatigue and fever.  HENT: Negative for congestion, ear pain, hearing loss, postnasal drip, sinus pressure and sore throat.   Eyes: Negative for blurred vision, photophobia and pain.  Respiratory: Negative for cough.   Cardiovascular: Negative for syncope and near-syncope.  Gastrointestinal: Negative for abdominal pain, diarrhea, nausea and vomiting.  Genitourinary: Negative for difficulty urinating.  Musculoskeletal: Negative for back pain, myalgias, neck pain and neck stiffness.  Skin: Negative for rash.  Neurological:  Positive for headaches. Negative for dizziness, tremors, focal weakness, seizures, syncope, facial asymmetry, speech difficulty, weakness, numbness, paresthesias and loss of balance.  Psychiatric/Behavioral: Negative for agitation and confusion.  All other systems reviewed and are negative.   Physical Exam Updated Vital Signs BP (!) 143/92 (BP Location: Right Wrist)   Pulse 76   Temp 98.5 F (36.9 C) (Oral)   Resp 14   Ht 5\' 1"  (1.549 m)   Wt 105.2 kg   LMP 04/26/2020   SpO2 100%   BMI 43.84 kg/m   Physical Exam Vitals and nursing note reviewed.  Constitutional:      General: She is not in acute distress.    Appearance: Normal appearance.     Comments: Lights on resting comfortably, no non verbal signs of pain   HENT:     Head: Normocephalic and atraumatic.     Nose: Nose normal.     Mouth/Throat:     Mouth: Mucous membranes are moist.     Pharynx: Oropharynx is clear.  Eyes:     Extraocular Movements: Extraocular movements intact.     Conjunctiva/sclera: Conjunctivae normal.     Pupils: Pupils are equal, round, and reactive to light.     Comments: No proptosis disk margins sharp  Cardiovascular:     Rate and Rhythm: Normal rate and regular rhythm.     Pulses: Normal pulses.     Heart sounds: Normal heart sounds.  Pulmonary:     Effort: Pulmonary effort is normal.     Breath sounds: Normal breath sounds.  Abdominal:     General: Abdomen is flat. Bowel sounds are normal.     Palpations: Abdomen is soft.     Tenderness: There is no abdominal tenderness. There is no guarding.  Musculoskeletal:        General: Normal range of motion.     Cervical back: Normal range of motion and neck supple. No rigidity.  Lymphadenopathy:     Cervical: No cervical adenopathy.  Skin:    General: Skin is warm and dry.     Capillary Refill: Capillary refill takes less than 2 seconds.  Neurological:     General: No focal deficit present.     Mental Status: She is alert and oriented to  person, place, and time.     Cranial Nerves: No cranial nerve deficit.     Deep Tendon Reflexes: Reflexes normal.  Psychiatric:        Mood and Affect: Mood normal.        Behavior: Behavior normal.     ED Results / Procedures / Treatments   Labs (all labs ordered are listed, but only abnormal results are displayed) Labs Reviewed  I-STAT BETA HCG BLOOD, ED ([facility], [facility], [facility] ONLY)    EKG None  Radiology No results found.  Procedures Procedures (including critical care time)  Medications Ordered in ED Medications  ketorolac (TORADOL) injection 60 mg (has no administration in time range)  metoCLOPramide (REGLAN) tablet 10 mg (has no  administration in time range)  diphenhydrAMINE (BENADRYL) 12.5 MG/5ML elixir 12.5 mg (has no administration in time range)    ED Course  I have reviewed the triage vital signs and the nursing notes.  Pertinent labs & imaging results that were available during my care of the patient were reviewed by me and considered in my medical decision making (see chart for details).   BP normalizes without intervention.  Doubt intracranial infection no fevers no neck stiffess nor other infectious symptoms,  doubt ICH,  Well appearing no emesis.  Also doubt cavernous sinus thrombosis, disk are sharp, EOMI cognition is intact.  CT is unremarkable.    Gina Patel was evaluated in Emergency Department on 05/01/2020 for the symptoms described in the history of present illness. She was evaluated in the context of the global COVID-19 pandemic, which necessitated consideration that the patient might be at risk for infection with the SARS-CoV-2 virus that causes COVID-19. Institutional protocols and algorithms that pertain to the evaluation of patients at risk for COVID-19 are in a state of rapid change based on information released by regulatory bodies including the CDC and federal and state organizations. These policies and algorithms were followed during the patient's care  in the ED. ,  Final Clinical Impression(s) / ED Diagnoses Final diagnoses:  Headache disorder  Hypertension, unspecified type   Return for intractable cough, coughing up blood,fevers >100.4 unrelieved by medication, shortness of breath, intractable vomiting, chest pain, shortness of breath, weakness,numbness, changes in speech, facial asymmetry,abdominal pain, passing out,Inability to tolerate liquids or food, cough, altered mental status or any concerns. No signs of systemic illness or infection. The patient is nontoxic-appearing on exam and vital signs are within normal limits.   I have reviewed the triage vital signs and the nursing notes. Pertinent labs &imaging results that were available during my care of the patient were reviewed by me and considered in my medical decision making (see chart for details).After history, exam, and medical workup I feel the patient has beenappropriately medically screened and is safe for discharge home. Pertinent diagnoses were discussed with the patient. Patient was given return precautions.   Needham Biggins, MD 05/01/20 [phone]

## 2021-03-30 ENCOUNTER — Emergency Department (HOSPITAL_COMMUNITY)
Admission: EM | Admit: 2021-03-30 | Discharge: 2021-03-31 | Disposition: A | Discharge status: Home / Self Care | Payer: Medicaid Other | Attending: Emergency Medicine | Admitting: Emergency Medicine

## 2021-03-30 ENCOUNTER — Other Ambulatory Visit: Payer: Self-pay

## 2021-03-30 DIAGNOSIS — Z20822 Contact with and (suspected) exposure to covid-19: Secondary | ICD-10-CM | POA: Insufficient documentation

## 2021-03-30 DIAGNOSIS — J029 Acute pharyngitis, unspecified: Secondary | ICD-10-CM

## 2021-03-30 DIAGNOSIS — I10 Essential (primary) hypertension: Secondary | ICD-10-CM | POA: Insufficient documentation

## 2021-03-30 DIAGNOSIS — F17210 Nicotine dependence, cigarettes, uncomplicated: Secondary | ICD-10-CM | POA: Insufficient documentation

## 2021-03-30 DIAGNOSIS — H6690 Otitis media, unspecified, unspecified ear: Secondary | ICD-10-CM

## 2021-03-30 DIAGNOSIS — H6691 Otitis media, unspecified, right ear: Secondary | ICD-10-CM | POA: Insufficient documentation

## 2021-03-30 DIAGNOSIS — H9201 Otalgia, right ear: Secondary | ICD-10-CM | POA: Diagnosis present

## 2021-03-31 ENCOUNTER — Encounter (HOSPITAL_COMMUNITY): Payer: Self-pay

## 2021-03-31 LAB — GROUP A STREP BY PCR: Group A Strep by PCR: NOT DETECTED

## 2021-03-31 LAB — RESP PANEL BY RT-PCR (FLU A&B, COVID) ARPGX2
Influenza A by PCR: NEGATIVE
Influenza B by PCR: NEGATIVE
SARS Coronavirus 2 by RT PCR: NEGATIVE

## 2021-03-31 MED ORDER — AMOXICILLIN-POT CLAVULANATE 875-125 MG PO TABS
1.0000 | ORAL_TABLET | Freq: Once | ORAL | Status: AC
  Administered 2021-03-31: 1 via ORAL
  Filled 2021-03-31: qty 1

## 2021-03-31 MED ORDER — AMOXICILLIN-POT CLAVULANATE 875-125 MG PO TABS: 1.0000 | ORAL_TABLET | Freq: Two times a day (BID) | ORAL | 0 refills | Status: DC

## 2021-03-31 NOTE — ED Triage Notes (Signed)
Pt reports that she has had a sore throat for the past day with chills and ear pain.

## 2021-03-31 NOTE — ED Provider Notes (Signed)
[facility] EMERGENCY DEPARTMENT Provider Note   CSN: [id number] Arrival date & time: 03/30/21  2344     History Chief Complaint  Patient presents with   Sore Throat    Gina Patel is a 45 y.o. female history of hypertension here presenting with sore throat.  Patient has sore throat for the last day or so.  Patient has some right ear pain and subjective chills as well.  Patient states that she has some sharp pain in her throat as well.  Denies any sick contacts.  Has a history of hypertension.  The history is provided by the patient.      Past Medical History:  Diagnosis Date   Hypertension     There are no problems to display for this patient.   Past Surgical History:  Procedure Laterality Date   CHOLECYSTECTOMY       OB History   No obstetric history on file.     Family History  Family history unknown: Yes    Social History   Tobacco Use   Smoking status: Light Smoker    Types: Cigarettes    Home Medications Prior to Admission medications   Medication Sig Start Date End Date Taking? Authorizing Provider  amoxicillin-clavulanate (AUGMENTIN) 875-125 MG tablet Take 1 tablet by mouth every 12 (twelve) hours. 03/31/21  Yes Charlynne Pander, MD  acetaminophen (TYLENOL) 325 MG tablet Take 650 mg by mouth every 6 (six) hours as needed for headache.    [provider]  naproxen (NAPROSYN) 375 MG tablet Take 1 tablet (375 mg total) by mouth 2 (two) times daily. 05/01/20   Palumbo, April, MD    Allergies    Patient has no known allergies.  Review of Systems   Review of Systems  HENT:  Positive for sore throat.   All other systems reviewed and are negative.  Physical Exam Updated Vital Signs BP (!) 134/96 (BP Location: Right Arm)   Pulse 88   Temp 98.8 F (37.1 C) (Oral)   Resp 17   SpO2 99%   Physical Exam Vitals and nursing note reviewed.  Constitutional:      Comments: Slightly uncomfortable  HENT:     Head:  Normocephalic.     Ears:     Comments: Patient does have earwax in the right ear and appears to have a right otitis media.  Left TM normal.    Mouth/Throat:     Mouth: Mucous membranes are moist.     Comments: Patient has good dentition overall and no obvious periapical abscess.  Floor the mouth is nontender.  Patient has normal tonsils bilaterally. Eyes:     Conjunctiva/sclera: Conjunctivae normal.  Neck:     Comments: Patient has right cervical lymphadenopathy. Cardiovascular:     Rate and Rhythm: Normal rate and regular rhythm.  Pulmonary:     Effort: Pulmonary effort is normal.     Breath sounds: Normal breath sounds.  Abdominal:     General: Bowel sounds are normal.     Palpations: Abdomen is soft.  Skin:    General: Skin is warm.     Capillary Refill: Capillary refill takes less than 2 seconds.  Neurological:     General: No focal deficit present.     Mental Status: She is alert and oriented to person, place, and time.  Psychiatric:        Mood and Affect: Mood normal.        Behavior: Behavior normal.  ED Results / Procedures / Treatments   Labs (all labs ordered are listed, but only abnormal results are displayed) Labs Reviewed  GROUP A STREP BY PCR  RESP PANEL BY RT-PCR (FLU A&B, COVID) [id number]    EKG None  Radiology No results found.  Procedures Procedures   Medications Ordered in ED Medications  amoxicillin-clavulanate (AUGMENTIN) 875-125 MG per tablet 1 tablet (1 tablet Oral Given 03/31/21 0329)    ED Course  I have reviewed the triage vital signs and the nursing notes.  Pertinent labs & imaging results that were available during my care of the patient were reviewed by me and considered in my medical decision making (see chart for details).    MDM Rules/Calculators/A&P                           Gina Patel is a 45 y.o. female here with sore throat and ear pain.  Patient does have right otitis media.  Patient's strep and COVID tests are  negative. No obvious dental infection and floor of mouth is soft and she has no trismus.  Her right cervical lymphadenopathy is likely secondary to otitis media.  Will discharge patient home with Augmentin.  She was initially tachycardic that resolved in the ED.   Final Clinical Impression(s) / ED Diagnoses Final diagnoses:  Acute otitis media, unspecified otitis media type  Viral pharyngitis    Rx / DC Orders ED Discharge Orders          Ordered    amoxicillin-clavulanate (AUGMENTIN) 875-125 MG tablet  Every 12 hours        03/31/21 0333             Charlynne Pander, MD 03/31/21 [phone]

## 2021-03-31 NOTE — Discharge Instructions (Signed)
You have a right ear infection.   Take Augmentin twice daily for a week.  Stay hydrated.  Take Tylenol or Motrin for fever   Your COVID test and strep test is negative right now  Exam to ER if you have worse sore throat, ear pain, trouble swallowing

## 2021-03-31 NOTE — ED Provider Notes (Signed)
Emergency Medicine Provider Triage Evaluation Note  Lejla Moeser , a 45 y.o. female  was evaluated in triage.  Pt complains of sore throat for the past 24 hours, worse tonight.  Subjective fever/chills.  No sick contacts.  Review of Systems  Positive: Sore throat, chills Negative: vomiting  Physical Exam  BP 135/90   Pulse (!) 121   Temp 98.5 F (36.9 C) (Oral)   Resp 18   SpO2 98%  Gen:   Awake, no distress, actively chewing gum Resp:  Normal effort  MSK:   Moves extremities without difficulty  Other:  Tonsils overall normal in appearance bilaterally without exudate; uvula midline without evidence of peritonsillar abscess; handling secretions appropriately; no difficulty swallowing or speaking; normal phonation without stridor  Medical Decision Making  Medically screening exam initiated at 12:07 AM.  Appropriate orders placed.  Monika Chestang was informed that the remainder of the evaluation will be completed by another provider, this initial triage assessment does not replace that evaluation, and the importance of remaining in the ED until their evaluation is complete.  Sore throat.    Rapid strep and covid screen sent.   Garlon Hatchet, PA-C 03/31/21 0008    Charlynne Pander, MD 03/31/21 [phone]

## 2021-08-10 ENCOUNTER — Other Ambulatory Visit: Payer: Self-pay

## 2021-08-10 ENCOUNTER — Encounter (HOSPITAL_COMMUNITY): Payer: Self-pay | Admitting: Orthopedic Surgery

## 2021-08-10 NOTE — Progress Notes (Signed)
Anesthesia Review:  PCP: [facility]  Cardiologist : none  Chest x-ray : EKG : Echo : Stress test: Cardiac Cath :  Activity level: can do a flight of stairs without difficulty  Sleep Study/ CPAP : none  Fasting Blood Sugar :      / Checks Blood Sugar -- times a day:   Blood Thinner/ Instructions /Last Dose: ASA / Instructions/ Last Dose :

## 2021-08-12 ENCOUNTER — Ambulatory Visit: Payer: Self-pay | Admitting: Physician Assistant

## 2021-08-12 NOTE — H&P (View-Only) (Signed)
Gina Patel is an 46 y.o. female.   Chief Complaint: right knee pain HPI: She comes in for a follow-up from the Urgent Care.  She works at [employer].  No history of trauma. She I has an exquisitely painful knee. She has been given Ibuprofen. The problem started late in the year/ early this year. She has an antalgic gait. Twisting and turning cause  terrible pain in her knee. This  is affecting her ability to work to the point where they will put her out, time frame yet determined.  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    Past Medical History:  Diagnosis Date   Anxiety    Arthritis    Depression    GERD (gastroesophageal reflux disease)     Past Surgical History:  Procedure Laterality Date   CHOLECYSTECTOMY      Family History  Family history unknown: Yes   Social History:  reports that she has been smoking cigarettes. She does not have any smokeless tobacco history on file. She reports that she does not drink alcohol and does not use drugs.  Allergies: No Known Allergies  (Not in a hospital admission)   No results found for this or any previous visit (from the past 48 hour(s)). No results found.  Review of Systems  Musculoskeletal:  Positive for arthralgias.  Psychiatric/Behavioral:  The patient is nervous/anxious.   All other systems reviewed and are negative.  Last menstrual period 07/26/2021. Physical Exam Constitutional:      General: She is not in acute distress.    Appearance: Normal appearance.  HENT:     Head: Normocephalic and atraumatic.  Eyes:     Extraocular Movements: Extraocular movements intact.     Pupils: Pupils are equal, round, and reactive to light.  Cardiovascular:     Rate and Rhythm: Normal rate.     Pulses: Normal pulses.  Pulmonary:     Effort: Pulmonary effort is normal. No respiratory distress.  Abdominal:     General: Abdomen is flat. There is no  distension.     Palpations: Abdomen is soft.  Musculoskeletal:     Cervical back: Normal range of motion and neck supple.     Comments: Reproducibly tender over the lateral aspect of the patellofemoral joint with crepitus.   Skin:    General: Skin is warm and dry.     Findings: No erythema or rash.  Neurological:     General: No focal deficit present.     Mental Status: She is alert and oriented to person, place, and time.  Psychiatric:        Mood and Affect: Mood normal.        Behavior: Behavior normal.     Assessment/Plan She returns with absolutely no improvement with home exercises.  Exercises and quad strengthening aggravated her pain.  She has a relatively physical job.  She is on leave until April.  She did not respond at all to intraarticular corticosteroid injection or treatment with prescription anti-inflammatories.  Reproducibly tender over the lateral aspect of the patellofemoral joint with crepitus.  Repeat patellar films show some bias to the lateral side of the trochlear groove.  She does have full thickness cartilage loss in the lateral facet.  She is a young patient who is trying to work.  I think she is a candidate for arthroscopic evaluation of the right knee.  Risks and benefits discussed in detail.  She would  be post-op rehab for 4-6 weeks.  She already has a brace as well.  Postoperative aspirin for DVT prophylaxis.  We will see about getting that organized for her.  She will follow up to see me potentially after surgery.    Margart Sickles, PA-C 08/12/2021, 10:10 PM

## 2021-08-12 NOTE — H&P (Signed)
Gina Patel is an 46 y.o. female.   Chief Complaint: right knee pain HPI: She comes in for a follow-up from the Urgent Care.  She works at [employer].  No history of trauma. She I has an exquisitely painful knee. She has been given Ibuprofen. The problem started late in the year/ early this year. She has an antalgic gait. Twisting and turning cause  terrible pain in her knee. This  is affecting her ability to work to the point where they will put her out, time frame yet determined.  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    Past Medical History:  Diagnosis Date   Anxiety    Arthritis    Depression    GERD (gastroesophageal reflux disease)     Past Surgical History:  Procedure Laterality Date   CHOLECYSTECTOMY      Family History  Family history unknown: Yes   Social History:  reports that she has been smoking cigarettes. She does not have any smokeless tobacco history on file. She reports that she does not drink alcohol and does not use drugs.  Allergies: No Known Allergies  (Not in a hospital admission)   No results found for this or any previous visit (from the past 48 hour(s)). No results found.  Review of Systems  Musculoskeletal:  Positive for arthralgias.  Psychiatric/Behavioral:  The patient is nervous/anxious.   All other systems reviewed and are negative.  Last menstrual period 07/26/2021. Physical Exam Constitutional:      General: She is not in acute distress.    Appearance: Normal appearance.  HENT:     Head: Normocephalic and atraumatic.  Eyes:     Extraocular Movements: Extraocular movements intact.     Pupils: Pupils are equal, round, and reactive to light.  Cardiovascular:     Rate and Rhythm: Normal rate.     Pulses: Normal pulses.  Pulmonary:     Effort: Pulmonary effort is normal. No respiratory distress.  Abdominal:     General: Abdomen is flat. There is no  distension.     Palpations: Abdomen is soft.  Musculoskeletal:     Cervical back: Normal range of motion and neck supple.     Comments: Reproducibly tender over the lateral aspect of the patellofemoral joint with crepitus.   Skin:    General: Skin is warm and dry.     Findings: No erythema or rash.  Neurological:     General: No focal deficit present.     Mental Status: She is alert and oriented to person, place, and time.  Psychiatric:        Mood and Affect: Mood normal.        Behavior: Behavior normal.     Assessment/Plan She returns with absolutely no improvement with home exercises.  Exercises and quad strengthening aggravated her pain.  She has a relatively physical job.  She is on leave until April.  She did not respond at all to intraarticular corticosteroid injection or treatment with prescription anti-inflammatories.  Reproducibly tender over the lateral aspect of the patellofemoral joint with crepitus.  Repeat patellar films show some bias to the lateral side of the trochlear groove.  She does have full thickness cartilage loss in the lateral facet.  She is a young patient who is trying to work.  I think she is a candidate for arthroscopic evaluation of the right knee.  Risks and benefits discussed in detail.  She would  be post-op rehab for 4-6 weeks.  She already has a brace as well.  Postoperative aspirin for DVT prophylaxis.  We will see about getting that organized for her.  She will follow up to see me potentially after surgery.   ° °Gina Paull, PA-C °08/12/2021, 10:10 PM ° ° ° °

## 2021-08-24 NOTE — Progress Notes (Signed)
For Short Stay: COVID SWAB appointment date: Date of COVID positive in last 90 days:  Bowel Prep reminder:   For Anesthesia: PCP -  Cardiologist -   Chest x-ray -  EKG -  Stress Test -  ECHO -  Cardiac Cath -  Pacemaker/ICD device last checked: Pacemaker orders received: Device Rep notified:  Spinal Cord Stimulator:  Sleep Study -  CPAP -   Fasting Blood Sugar -  Checks Blood Sugar _____ times a day Date and result of last Hgb A1c-  Blood Thinner Instructions: Aspirin Instructions: Last Dose:  Activity level: Can go up a flight of stairs and activities of daily living without stopping and without chest pain and/or shortness of breath   Able to exercise without chest pain and/or shortness of breath   Unable to go up a flight of stairs without chest pain and/or shortness of breath     Anesthesia review:   Patient denies shortness of breath, fever, cough and chest pain at PAT appointment   Patient verbalized understanding of instructions that were given to them at the PAT appointment. Patient was also instructed that they will need to review over the PAT instructions again at home before surgery.

## 2021-08-30 ENCOUNTER — Other Ambulatory Visit: Payer: Self-pay

## 2021-08-30 ENCOUNTER — Encounter (HOSPITAL_COMMUNITY): Payer: Self-pay | Admitting: Orthopedic Surgery

## 2021-08-30 NOTE — Progress Notes (Signed)
COVID swab appointment:  N/A ? ?COVID Vaccine Completed: No ?Date COVID Vaccine completed: ?Has received booster: ?COVID vaccine manufacturer: [employer]  ? ?Date of COVID positive in last 90 days:  No ? ?PCP - [facility]  ?Cardiologist - N/A ? ?Chest x-ray - N/A ?EKG - N/A ?Stress Test - N/A ?ECHO - N/A ?Cardiac Cath - N/A ?Pacemaker/ICD device last checked: ?Spinal Cord Stimulator: ? ?Bowel Prep - N/A ? ?Sleep Study - N/A ?CPAP -  ? ?Fasting Blood Sugar - N/A ?Checks Blood Sugar _____ times a day ? ?Blood Thinner Instructions:N/A ?Aspirin Instructions: ?Last Dose: ? ?Activity level:   Can go up a flight of stairs and perform activities of daily living without stopping and without symptoms of chest pain or shortness of breath. ?   ?Anesthesia review:  N/A ? ?Patient denies shortness of breath, fever, cough and chest pain at PAT appointment ? ? ?Patient verbalized understanding of instructions that were given to them at the PAT appointment. Patient was also instructed that they will need to review over the PAT instructions again at home before surgery.  ?

## 2021-09-03 ENCOUNTER — Encounter (HOSPITAL_COMMUNITY): Payer: Self-pay | Admitting: Orthopedic Surgery

## 2021-09-03 ENCOUNTER — Other Ambulatory Visit: Payer: Self-pay

## 2021-09-03 ENCOUNTER — Encounter (HOSPITAL_COMMUNITY)
Admission: RE | Disposition: A | Discharge status: Home / Self Care | Payer: Self-pay | Source: Ambulatory Visit | Attending: Orthopedic Surgery

## 2021-09-03 ENCOUNTER — Ambulatory Visit (HOSPITAL_COMMUNITY)
Admission: RE | Admit: 2021-09-03 | Discharge: 2021-09-03 | Disposition: A | Discharge status: Home / Self Care | Payer: Medicaid Other | Source: Ambulatory Visit | Attending: Orthopedic Surgery | Admitting: Orthopedic Surgery

## 2021-09-03 ENCOUNTER — Ambulatory Visit (HOSPITAL_BASED_OUTPATIENT_CLINIC_OR_DEPARTMENT_OTHER): Payer: Medicaid Other | Admitting: Anesthesiology

## 2021-09-03 ENCOUNTER — Ambulatory Visit (HOSPITAL_COMMUNITY): Payer: Medicaid Other | Admitting: Anesthesiology

## 2021-09-03 DIAGNOSIS — M94261 Chondromalacia, right knee: Secondary | ICD-10-CM | POA: Diagnosis not present

## 2021-09-03 DIAGNOSIS — M2419 Other articular cartilage disorders, other specified site: Secondary | ICD-10-CM

## 2021-09-03 DIAGNOSIS — M85661 Other cyst of bone, right lower leg: Secondary | ICD-10-CM

## 2021-09-03 DIAGNOSIS — F17210 Nicotine dependence, cigarettes, uncomplicated: Secondary | ICD-10-CM | POA: Insufficient documentation

## 2021-09-03 DIAGNOSIS — M2241 Chondromalacia patellae, right knee: Secondary | ICD-10-CM | POA: Diagnosis not present

## 2021-09-03 DIAGNOSIS — M1990 Unspecified osteoarthritis, unspecified site: Secondary | ICD-10-CM | POA: Diagnosis not present

## 2021-09-03 DIAGNOSIS — Z01818 Encounter for other preprocedural examination: Secondary | ICD-10-CM

## 2021-09-03 DIAGNOSIS — R2689 Other abnormalities of gait and mobility: Secondary | ICD-10-CM | POA: Insufficient documentation

## 2021-09-03 HISTORY — PX: KNEE ARTHROSCOPY WITH LATERAL RELEASE: SHX5649

## 2021-09-03 HISTORY — DX: Unspecified osteoarthritis, unspecified site: M19.90

## 2021-09-03 HISTORY — DX: Gastro-esophageal reflux disease without esophagitis: K21.9

## 2021-09-03 HISTORY — DX: Depression, unspecified: F32.A

## 2021-09-03 HISTORY — DX: Anxiety disorder, unspecified: F41.9

## 2021-09-03 LAB — CBC
HCT: 32.7 % — ABNORMAL LOW (ref 36.0–46.0)
Hemoglobin: 10.9 g/dL — ABNORMAL LOW (ref 12.0–15.0)
MCH: 25.5 pg — ABNORMAL LOW (ref 26.0–34.0)
MCHC: 33.3 g/dL (ref 30.0–36.0)
MCV: 76.6 fL — ABNORMAL LOW (ref 80.0–100.0)
Platelets: 380 10*3/uL (ref 150–400)
RBC: 4.27 MIL/uL (ref 3.87–5.11)
RDW: 16.1 % — ABNORMAL HIGH (ref 11.5–15.5)
WBC: 12.4 10*3/uL — ABNORMAL HIGH (ref 4.0–10.5)
nRBC: 0 % (ref 0.0–0.2)

## 2021-09-03 LAB — PREGNANCY, URINE: Preg Test, Ur: NEGATIVE

## 2021-09-03 SURGERY — ARTHROSCOPY, KNEE, WITH LATERAL RETINACULUM RELEASE
Anesthesia: General | Site: Knee | Laterality: Right

## 2021-09-03 MED ORDER — ONDANSETRON HCL 4 MG/2ML IJ SOLN
INTRAMUSCULAR | Status: DC | PRN
  Administered 2021-09-03: 4 mg via INTRAVENOUS

## 2021-09-03 MED ORDER — DOCUSATE SODIUM 100 MG PO CAPS
100.0000 mg | ORAL_CAPSULE | Freq: Two times a day (BID) | ORAL | Status: DC
  Filled 2021-09-03: qty 1

## 2021-09-03 MED ORDER — BUPIVACAINE-EPINEPHRINE (PF) 0.5% -1:200000 IJ SOLN
INTRAMUSCULAR | Status: DC | PRN
  Administered 2021-09-03: 25 mL

## 2021-09-03 MED ORDER — DEXAMETHASONE SODIUM PHOSPHATE 10 MG/ML IJ SOLN
INTRAMUSCULAR | Status: DC | PRN
Start: 2021-09-03 — End: 2021-09-03
  Administered 2021-09-03: 5 mg via INTRAVENOUS

## 2021-09-03 MED ORDER — MIDAZOLAM HCL 2 MG/2ML IJ SOLN
INTRAMUSCULAR | Status: DC | PRN
  Administered 2021-09-03: 2 mg via INTRAVENOUS

## 2021-09-03 MED ORDER — BUPIVACAINE-EPINEPHRINE (PF) 0.5% -1:200000 IJ SOLN
INTRAMUSCULAR | Status: AC
  Filled 2021-09-03: qty 30

## 2021-09-03 MED ORDER — CELECOXIB 200 MG PO CAPS: 200.0000 mg | ORAL_CAPSULE | Freq: Two times a day (BID) | ORAL | Status: DC

## 2021-09-03 MED ORDER — FENTANYL CITRATE PF 50 MCG/ML IJ SOSY
25.0000 ug | PREFILLED_SYRINGE | INTRAMUSCULAR | Status: DC | PRN
  Administered 2021-09-03 (×3): 50 ug via INTRAVENOUS

## 2021-09-03 MED ORDER — METOCLOPRAMIDE HCL 5 MG PO TABS
5.0000 mg | ORAL_TABLET | Freq: Three times a day (TID) | ORAL | Status: DC | PRN
  Filled 2021-09-03: qty 2

## 2021-09-03 MED ORDER — ONDANSETRON HCL 4 MG/2ML IJ SOLN
INTRAMUSCULAR | Status: AC
  Filled 2021-09-03: qty 2

## 2021-09-03 MED ORDER — FENTANYL CITRATE PF 50 MCG/ML IJ SOSY
PREFILLED_SYRINGE | INTRAMUSCULAR | Status: DC
Start: 2021-09-03 — End: 2021-09-03
  Filled 2021-09-03: qty 3

## 2021-09-03 MED ORDER — OXYCODONE HCL 5 MG PO TABS
ORAL_TABLET | ORAL | 0 refills | Status: AC
Start: 2021-09-03 — End: ?

## 2021-09-03 MED ORDER — PROPOFOL 10 MG/ML IV BOLUS
INTRAVENOUS | Status: AC
  Filled 2021-09-03: qty 20

## 2021-09-03 MED ORDER — METHOCARBAMOL 500 MG PO TABS: 500.0000 mg | ORAL_TABLET | Freq: Four times a day (QID) | ORAL | 0 refills | Status: AC | PRN

## 2021-09-03 MED ORDER — CHLORHEXIDINE GLUCONATE 0.12 % MT SOLN
15.0000 mL | Freq: Once | OROMUCOSAL | Status: AC
Start: 2021-09-03 — End: 2021-09-03
  Administered 2021-09-03: 15 mL via OROMUCOSAL

## 2021-09-03 MED ORDER — METHYLPREDNISOLONE ACETATE 80 MG/ML IJ SUSP
INTRAMUSCULAR | Status: DC | PRN
  Administered 2021-09-03: 80 mg via INTRA_ARTICULAR

## 2021-09-03 MED ORDER — SODIUM CHLORIDE 0.9 % IV SOLN: INTRAVENOUS | Status: DC

## 2021-09-03 MED ORDER — LACTATED RINGERS IV SOLN
INTRAVENOUS | Status: DC
Start: 2021-09-03 — End: 2021-09-03

## 2021-09-03 MED ORDER — CELECOXIB 200 MG PO CAPS
200.0000 mg | ORAL_CAPSULE | Freq: Once | ORAL | Status: AC
  Administered 2021-09-03: 200 mg via ORAL
  Filled 2021-09-03: qty 1

## 2021-09-03 MED ORDER — METHOCARBAMOL 500 MG PO TABS: 500.0000 mg | ORAL_TABLET | Freq: Four times a day (QID) | ORAL | Status: DC | PRN

## 2021-09-03 MED ORDER — OXYCODONE HCL 5 MG PO TABS
ORAL_TABLET | ORAL | Status: AC
  Filled 2021-09-03: qty 1

## 2021-09-03 MED ORDER — ACETAMINOPHEN 500 MG PO TABS: 1000.0000 mg | ORAL_TABLET | Freq: Four times a day (QID) | ORAL | Status: DC

## 2021-09-03 MED ORDER — ORAL CARE MOUTH RINSE: 15.0000 mL | Freq: Once | OROMUCOSAL | Status: AC

## 2021-09-03 MED ORDER — OXYCODONE HCL 5 MG PO TABS
5.0000 mg | ORAL_TABLET | Freq: Once | ORAL | Status: AC
  Administered 2021-09-03: 5 mg via ORAL

## 2021-09-03 MED ORDER — ASPIRIN EC 81 MG PO TBEC: 81.0000 mg | DELAYED_RELEASE_TABLET | Freq: Two times a day (BID) | ORAL | 0 refills | Status: AC

## 2021-09-03 MED ORDER — METOCLOPRAMIDE HCL 5 MG/ML IJ SOLN: 5.0000 mg | Freq: Three times a day (TID) | INTRAMUSCULAR | Status: DC | PRN

## 2021-09-03 MED ORDER — ONDANSETRON HCL 4 MG/2ML IJ SOLN: 4.0000 mg | Freq: Four times a day (QID) | INTRAMUSCULAR | Status: DC | PRN

## 2021-09-03 MED ORDER — LIDOCAINE HCL (CARDIAC) PF 100 MG/5ML IV SOSY
PREFILLED_SYRINGE | INTRAVENOUS | Status: DC | PRN
  Administered 2021-09-03: 60 mg via INTRAVENOUS

## 2021-09-03 MED ORDER — LIDOCAINE HCL (PF) 2 % IJ SOLN
INTRAMUSCULAR | Status: AC
  Filled 2021-09-03: qty 5

## 2021-09-03 MED ORDER — AMISULPRIDE (ANTIEMETIC) 5 MG/2ML IV SOLN: 10.0000 mg | Freq: Once | INTRAVENOUS | Status: DC | PRN

## 2021-09-03 MED ORDER — GABAPENTIN 300 MG PO CAPS
300.0000 mg | ORAL_CAPSULE | Freq: Once | ORAL | Status: AC
  Administered 2021-09-03: 300 mg via ORAL
  Filled 2021-09-03: qty 1

## 2021-09-03 MED ORDER — MIDAZOLAM HCL 2 MG/2ML IJ SOLN
INTRAMUSCULAR | Status: AC
  Filled 2021-09-03: qty 2

## 2021-09-03 MED ORDER — FENTANYL CITRATE (PF) 250 MCG/5ML IJ SOLN
INTRAMUSCULAR | Status: DC | PRN
  Administered 2021-09-03 (×5): 50 ug via INTRAVENOUS

## 2021-09-03 MED ORDER — METHYLPREDNISOLONE ACETATE 40 MG/ML IJ SUSP
INTRAMUSCULAR | Status: AC
  Filled 2021-09-03: qty 2

## 2021-09-03 MED ORDER — ONDANSETRON HCL 4 MG PO TABS
4.0000 mg | ORAL_TABLET | Freq: Four times a day (QID) | ORAL | Status: DC | PRN
  Filled 2021-09-03: qty 1

## 2021-09-03 MED ORDER — SODIUM CHLORIDE 0.9 % IR SOLN
Status: DC | PRN
  Administered 2021-09-03: 3000 mL

## 2021-09-03 MED ORDER — CEFAZOLIN SODIUM-DEXTROSE 2-4 GM/100ML-% IV SOLN
2.0000 g | INTRAVENOUS | Status: AC
  Administered 2021-09-03: 2 g via INTRAVENOUS
  Filled 2021-09-03: qty 100

## 2021-09-03 MED ORDER — ACETAMINOPHEN 500 MG PO TABS
1000.0000 mg | ORAL_TABLET | Freq: Once | ORAL | Status: AC
  Administered 2021-09-03: 1000 mg via ORAL
  Filled 2021-09-03: qty 2

## 2021-09-03 MED ORDER — FENTANYL CITRATE (PF) 250 MCG/5ML IJ SOLN
INTRAMUSCULAR | Status: AC
  Filled 2021-09-03: qty 5

## 2021-09-03 MED ORDER — PROPOFOL 10 MG/ML IV BOLUS
INTRAVENOUS | Status: DC | PRN
  Administered 2021-09-03: 200 mg via INTRAVENOUS

## 2021-09-03 MED ORDER — ACETAMINOPHEN 325 MG PO TABS: 325.0000 mg | ORAL_TABLET | Freq: Four times a day (QID) | ORAL | Status: DC | PRN

## 2021-09-03 MED ORDER — CELECOXIB 200 MG PO CAPS: 200.0000 mg | ORAL_CAPSULE | Freq: Two times a day (BID) | ORAL | 2 refills | Status: AC

## 2021-09-03 MED ORDER — OXYCODONE HCL 5 MG PO TABS: 5.0000 mg | ORAL_TABLET | ORAL | Status: DC | PRN

## 2021-09-03 MED ORDER — METHOCARBAMOL 500 MG IVPB - SIMPLE MED: 500.0000 mg | Freq: Four times a day (QID) | INTRAVENOUS | Status: DC | PRN

## 2021-09-03 MED ORDER — HYDROMORPHONE HCL 1 MG/ML IJ SOLN: 0.5000 mg | INTRAMUSCULAR | Status: DC | PRN

## 2021-09-03 SURGICAL SUPPLY — 37 items
BNDG ELASTIC 6X5.8 VLCR STR LF (GAUZE/BANDAGES/DRESSINGS) ×2 IMPLANT
CHLORAPREP W/TINT 26 (MISCELLANEOUS) ×1 IMPLANT
CUFF TOURN SGL QUICK 34 (TOURNIQUET CUFF)
CUFF TRNQT CYL 34X4.125X (TOURNIQUET CUFF) ×1 IMPLANT
DISSECTOR  3.8MM X 13CM (MISCELLANEOUS) ×1
DISSECTOR 3.8MM X 13CM (MISCELLANEOUS) ×1 IMPLANT
DISSECTOR 4.0MM X 13CM (MISCELLANEOUS) IMPLANT
DRAPE ARTHROSCOPY W/POUCH 114 (DRAPES) ×2 IMPLANT
DRAPE IMP U-DRAPE 54X76 (DRAPES) ×2 IMPLANT
DRAPE U-SHAPE 47X51 STRL (DRAPES) ×2 IMPLANT
DRSG EMULSION OIL 3X3 NADH (GAUZE/BANDAGES/DRESSINGS) ×2 IMPLANT
DRSG PAD ABDOMINAL 8X10 ST (GAUZE/BANDAGES/DRESSINGS) ×1 IMPLANT
ELECT MENISCUS 165MM 90D (ELECTRODE) IMPLANT
ELECT REM PT RETURN 15FT ADLT (MISCELLANEOUS) IMPLANT
EXCALIBUR 3.8MM X 13CM (MISCELLANEOUS) IMPLANT
GAUZE SPONGE 4X4 12PLY STRL (GAUZE/BANDAGES/DRESSINGS) ×2 IMPLANT
GLOVE SRG 8 PF TXTR STRL LF DI (GLOVE) ×1 IMPLANT
GLOVE SURG ENC MOIS LTX SZ7.5 (GLOVE) ×4 IMPLANT
GLOVE SURG UNDER POLY LF SZ7.5 (GLOVE) ×4 IMPLANT
GLOVE SURG UNDER POLY LF SZ8 (GLOVE) ×1
GLOVE SURG UNDER POLY LF SZ8.5 (GLOVE) IMPLANT
GOWN STRL REUS W/ TWL LRG LVL3 (GOWN DISPOSABLE) ×1 IMPLANT
GOWN STRL REUS W/ TWL XL LVL3 (GOWN DISPOSABLE) ×1 IMPLANT
GOWN STRL REUS W/TWL LRG LVL3 (GOWN DISPOSABLE) ×1
GOWN STRL REUS W/TWL XL LVL3 (GOWN DISPOSABLE) ×1
KIT BASIN OR (CUSTOM PROCEDURE TRAY) ×2 IMPLANT
MANIFOLD NEPTUNE II (INSTRUMENTS) ×2 IMPLANT
PACK ARTHROSCOPY DSU (CUSTOM PROCEDURE TRAY) ×2 IMPLANT
PENCIL BUTTON HOLSTER BLD 10FT (ELECTRODE) IMPLANT
PORT APPOLLO RF 90DEGREE MULTI (SURGICAL WAND) IMPLANT
PROBE HOOK APOLLO (SURGICAL WAND) ×1 IMPLANT
SUT ETHILON 3 0 PS 1 (SUTURE) ×2 IMPLANT
TAPE CLOTH 4X10 WHT NS (GAUZE/BANDAGES/DRESSINGS) IMPLANT
TOWEL OR 17X26 10 PK STRL BLUE (TOWEL DISPOSABLE) ×2 IMPLANT
TUBING ARTHROSCOPY IRRIG 16FT (MISCELLANEOUS) ×2 IMPLANT
WATER STERILE IRR 1000ML POUR (IV SOLUTION) ×2 IMPLANT
WRAP KNEE MAXI GEL POST OP (GAUZE/BANDAGES/DRESSINGS) ×2 IMPLANT

## 2021-09-03 NOTE — Anesthesia Procedure Notes (Signed)
Procedure Name: LMA Insertion ?Date/Time: 09/03/2021 10:44 AM ?Performed by: Raenette Rover, CRNA ?Pre-anesthesia Checklist: Patient identified, Emergency Drugs available, Suction available and Patient being monitored ?Patient Re-evaluated:Patient Re-evaluated prior to induction ?Oxygen Delivery Method: Circle system utilized ?Preoxygenation: Pre-oxygenation with 100% oxygen ?Induction Type: IV induction ?LMA: LMA inserted ?LMA Size: 4.0 ?Number of attempts: 1 ?Placement Confirmation: positive ETCO2 and breath sounds checked- equal and bilateral ?Tube secured with: Tape ?Dental Injury: Teeth and Oropharynx as per pre-operative assessment  ? ? ? ? ?

## 2021-09-03 NOTE — Op Note (Signed)
NAME: Gina Patel, Gina Patel ?MEDICAL RECORD NO: [id number] ?ACCOUNT NO: [id number] ?DATE OF BIRTH: [birth year] ?FACILITY: [facility] ?LOCATION: [facility] ?PHYSICIAN: W D. Carloyn Manner., MD ? ?Operative Report  ? ?DATE OF PROCEDURE: 09/03/2021 ? ?PREOPERATIVE DIAGNOSES: ?1.  Chondromalacia patella with lateral tracking. ?2.  Chondromalacia medial femoral condyle. ? ?OPERATION: ?1.  Debridement chondroplasty of patella and medial femoral condyle. ?2.  Arthroscopic lateral release right knee. ? ?SURGEON: W D. Carloyn Manner., MD ? ?ASSISTANT: [place].  In the record, the patient done in hospital setting with surgical assistant necessary due to high BMI. ? ?PROCEDURE DETAILS:  After general anesthetic, inferomedial and inferolateral portals created in knee.  The lateral side of the knee showed some very mild chondral irregularity on the tibial plateau, debrided. More punctate area of grade 3 chondral lesion ? on the medial femoral condyle over about a 2 x 2 cm area, debrided.  No grade 4 changes appreciated.  Menisci ACL were normal.  The patella showed significant chondral damage with grade 3 changes biased toward the lateral side of the patella with  ?minimal involvement of the trochlear groove.  We did an aggressive debridement of the patella surface with debridement chondroplasty.  We then used the Arthrex 90-degree wand to do a lateral release about 2 fingerbreadths above the superior pole  ?extending in line with the inferolateral portal.  This relieved pressure on the patella.  Stitches were used on the two incisions with infiltration of Marcaine with Depo-Medrol 80 into the joint with an additional 20 mL of Marcaine with epinephrine into  ?the lateral release site as well as the portals.  Placed in a bulky dressing and taken to recovery room in stable condition. ? ? ?VAI ?D: 09/03/2021 11:31:22 am T: 09/03/2021 11:19:00 pm  ?JOB: [id number]/ [id number]  ?

## 2021-09-03 NOTE — Anesthesia Preprocedure Evaluation (Signed)
Anesthesia Evaluation  ?Patient identified by MRN, date of birth, ID band ?Patient awake ? ? ? ?Reviewed: ?Allergy & Precautions, NPO status , Patient's Chart, lab work & pertinent test results ? ?Airway ?Mallampati: III ? ?TM Distance: >3 FB ?Neck ROM: Full ? ? ? Dental ? ?(+) Dental Advisory Given ?  ?Pulmonary ?Current Smoker and Patient abstained from smoking.,  ?  ?breath sounds clear to auscultation ? ? ? ? ? ? Cardiovascular ?negative cardio ROS ? ? ?Rhythm:Regular Rate:Normal ? ? ?  ?Neuro/Psych ?negative neurological ROS ?   ? GI/Hepatic ?Neg liver ROS, GERD  ,  ?Endo/Other  ?negative endocrine ROS ? Renal/GU ?negative Renal ROS  ? ?  ?Musculoskeletal ? ?(+) Arthritis ,  ? Abdominal ?  ?Peds ? Hematology ?negative hematology ROS ?(+)   ?Anesthesia Other Findings ? ? Reproductive/Obstetrics ? ?  ? ? ? ? ? ? ? ? ? ? ? ? ? ?  ?  ? ? ? ? ? ? ? ? ?Anesthesia Physical ?Anesthesia Plan ? ?ASA: 3 ? ?Anesthesia Plan: General  ? ?Post-op Pain Management: Gabapentin PO (pre-op)*, Celebrex PO (pre-op)* and Tylenol PO (pre-op)*  ? ?Induction: Intravenous ? ?PONV Risk Score and Plan: 2 and Dexamethasone, Ondansetron and Treatment may vary due to age or medical condition ? ?Airway Management Planned: LMA and Oral ETT ? ?Additional Equipment: None ? ?Intra-op Plan:  ? ?Post-operative Plan: Extubation in OR ? ?Informed Consent: I have reviewed the patients History and Physical, chart, labs and discussed the procedure including the risks, benefits and alternatives for the proposed anesthesia with the patient or authorized representative who has indicated his/her understanding and acceptance.  ? ? ? ?Dental advisory given ? ?Plan Discussed with: CRNA ? ?Anesthesia Plan Comments:   ? ? ? ? ? ? ?Anesthesia Quick Evaluation ? ?

## 2021-09-03 NOTE — Interval H&P Note (Signed)
History and Physical Interval Note: ? ?09/03/2021 ?9:39 AM ? 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  The various methods of treatment have been discussed with the patient and family. After consideration of risks, benefits and other options for treatment, the patient has consented to  Procedure(s): ?KNEE ARTHROSCOPY WITH LATERAL RELEASE, CHONDROPLASTY (Right) as a surgical intervention.  The patient's history has been reviewed, patient examined, no change in status, stable for surgery.  I have reviewed the patient's chart and labs.  Questions were answered to the patient's satisfaction.   ? ? ?W D Carloyn Manner ? ? ?

## 2021-09-03 NOTE — Brief Op Note (Signed)
09/03/2021 ? ?2:10 PM ? ?PATIENT:  Gina Patel  46 y.o. female ? 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 ?POST-OPERATIVE DIAGNOSIS:  RIGHT KNEE FULL THICKNESS CARTILAGE DEFECT WITH LARGE SUBCHONDRAL CYS ? ?PROCEDURE:  Procedure(s): ?KNEE ARTHROSCOPY WITH LATERAL RELEASE, CHONDROPLASTY, DEBRIDEMENT (Right) ? ?SURGEON:  Surgeon(s) and Role: ?   Earlie Server, MD - Primary ? ?PHYSICIAN ASSISTANT: Chriss Czar, PA-C ? ?ASSISTANTS:   ? ?ANESTHESIA:   local and general ? ?EBL:  minimal  ? ?BLOOD ADMINISTERED:none ? ?DRAINS: none  ? ?LOCAL MEDICATIONS USED:  MARCAINE    ? ?SPECIMEN:  No Specimen ? ?DISPOSITION OF SPECIMEN:  N/A ? ?COUNTS:  YES ? ?TOURNIQUET:  * No tourniquets in log * ? ?DICTATION: .Other Dictation: Dictation Number [id number] ? ?PLAN OF CARE: Discharge to home after PACU ? ?PATIENT DISPOSITION:  PACU - hemodynamically stable. ?  ?Delay start of Pharmacological VTE agent (>24hrs) due to surgical blood loss or risk of bleeding: yes ? ?

## 2021-09-03 NOTE — Transfer of Care (Signed)
Immediate Anesthesia Transfer of Care Note ? ?Patient: Gina Patel ? ?Procedure(s) Performed: KNEE ARTHROSCOPY WITH LATERAL RELEASE, CHONDROPLASTY, DEBRIDEMENT (Right: Knee) ? ?Patient Location: PACU ? ?Anesthesia Type:General ? ?Level of Consciousness: awake, drowsy and patient cooperative ? ?Airway & Oxygen Therapy: Patient Spontanous Breathing and Patient connected to face mask oxygen ? ?Post-op Assessment: Report given to RN and Post -op Vital signs reviewed and stable ? ?Post vital signs: Reviewed and stable ? ?Last Vitals:  ?Vitals Value Taken Time  ?BP 137/100 09/03/21 1126  ?Temp    ?Pulse 87 09/03/21 1129  ?Resp 14 09/03/21 1129  ?SpO2 98 % 09/03/21 1129  ?Vitals shown include unvalidated device data. ? ?Last Pain:  ?Vitals:  ? 09/03/21 0857  ?TempSrc:   ?PainSc: 0-No pain  ?   ? ?  ? ?Complications: No notable events documented. ?

## 2021-09-03 NOTE — Discharge Instructions (Signed)
Diet: As you were doing prior to hospitalization  ? ?Activity: Increase activity slowly as tolerated  ?No lifting or driving for [facility]. ? ?Shower: May shower without a dressing on post op day #3, NO SOAKING in tub  ? ?Dressing: You may change your dressing on post op day #3.  ?Then change the dressing daily with sterile 4"x4"s gauze dressing  ?Or band aids. ? ?Weight Bearing: weight bearing as tolerated. ? ?To prevent constipation: you may use a stool softener such as -  ?Colace ( over the counter) 100 mg by mouth twice a day  ?Drink plenty of fluids ( prune juice may be helpful) and high fiber foods  ?Miralax ( over the counter) for constipation as needed.  ? ?Precautions: If you experience chest pain or shortness of breath - call 911 immediately For transfer to the hospital emergency department!!  ?If you develop a fever greater that 101 F, purulent drainage from wound, increased redness or drainage from wound, or calf pain -- Call the office  ? ?Follow- Up Appointment: Please call for an appointment to be seen in 1 week  ?[facility] - [phone]  ?

## 2021-09-03 NOTE — Anesthesia Postprocedure Evaluation (Signed)
Anesthesia Post Note ? ?Patient: Glayds Insco ? ?Procedure(s) Performed: KNEE ARTHROSCOPY WITH LATERAL RELEASE, CHONDROPLASTY, DEBRIDEMENT (Right: Knee) ? ?  ? ?Patient location during evaluation: PACU ?Anesthesia Type: General ?Level of consciousness: awake and alert ?Pain management: pain level controlled ?Vital Signs Assessment: post-procedure vital signs reviewed and stable ?Respiratory status: spontaneous breathing, nonlabored ventilation, respiratory function stable and patient connected to nasal cannula oxygen ?Cardiovascular status: blood pressure returned to baseline and stable ?Postop Assessment: no apparent nausea or vomiting ?Anesthetic complications: no ? ? ?No notable events documented. ? ?Last Vitals:  ?Vitals:  ? 09/03/21 1215 09/03/21 1218  ?BP: (!) 128/111 (!) 129/95  ?Pulse: 79 82  ?Resp: 13 16  ?Temp:    ?SpO2: 95% 96%  ?  ?Last Pain:  ?Vitals:  ? 09/03/21 1227  ?TempSrc:   ?PainSc: 4   ? ? ?  ?  ?  ?  ?  ?  ? ?Marcene Duos E ? ? ? ? ?

## 2021-09-04 ENCOUNTER — Encounter (HOSPITAL_COMMUNITY): Payer: Self-pay | Admitting: Orthopedic Surgery

## 2021-09-06 ENCOUNTER — Ambulatory Visit: Payer: Medicaid Other

## 2021-09-06 NOTE — Therapy (Incomplete)
?OUTPATIENT PHYSICAL THERAPY LOWER EXTREMITY EVALUATION ? ? ?Patient Name: Gina Patel ?MRN: [id number] ?DOB:Aug 17, 1975, 46 y.o., female ?Today's Date: 09/06/2021 ? ? ? ?Past Medical History:  ?Diagnosis Date  ? Anxiety   ? Arthritis   ? Depression   ? GERD (gastroesophageal reflux disease)   ? ?Past Surgical History:  ?Procedure Laterality Date  ? CHOLECYSTECTOMY    ? KNEE ARTHROSCOPY WITH LATERAL RELEASE Right 09/03/2021  ? Procedure: KNEE ARTHROSCOPY WITH LATERAL RELEASE, CHONDROPLASTY, DEBRIDEMENT;  Surgeon: Frederico Hamman, MD;  Location: [facility];  Service: Orthopedics;  Laterality: Right;  ? ?There are no problems to display for this patient. ? ? ?PCP: Patient, No Pcp Per (Inactive) ? ?REFERRING PROVIDER: Frederico Hamman, MD ? ?REFERRING DIAG: Post op right knee scope with Lateral release  3.10.23 ? ?THERAPY DIAG:  ?No diagnosis found. ? ?ONSET DATE: *** ? ?SUBJECTIVE:  ? ?SUBJECTIVE STATEMENT: ?*** ? ?PERTINENT HISTORY: ?*** ? ?PAIN:  ?Are you having pain? {OPRCPAIN:27236} ? ?PRECAUTIONS: {Therapy precautions:24002} ? ?WEIGHT BEARING RESTRICTIONS {Yes ***/No:24003} ? ?FALLS:  ?Has patient fallen in last 6 months? {yes/no:20286}, Number of falls: *** ? ?LIVING ENVIRONMENT: ?Lives with: {[facility] lives with:25569::"lives with their family"} ?Lives in: {Lives in:25570} ?Stairs: {yes/no:20286}; {Stairs:24000} ?Has following equipment at home: {Assistive devices:23999} ? ?OCCUPATION: *** ? ?PLOF: {PLOF:24004} ? ?PATIENT GOALS *** ? ? ?OBJECTIVE:  ? ?DIAGNOSTIC FINDINGS: *** ? ?PATIENT SURVEYS:  ?{rehab surveys:24030} ? ?COGNITION: ? Overall cognitive status: {cognition:24006}   ?  ?SENSATION: ?{sensation:27233} ? ?MUSCLE LENGTH: ?Hamstrings: Right *** deg; Left *** deg ?Thomas test: Right *** deg; Left *** deg ? ?POSTURE:  ?*** ? ?PALPATION: ?*** ? ?LE ROM: ? ?{AROM/PROM:27142} ROM Right ?09/06/2021 Left ?09/06/2021  ?Hip flexion    ?Hip extension    ?Hip abduction    ?Hip adduction    ?Hip internal rotation    ?Hip  external rotation    ?Knee flexion    ?Knee extension    ?Ankle dorsiflexion    ?Ankle plantarflexion    ?Ankle inversion    ?Ankle eversion    ? (Blank rows = not tested) ? ?LE MMT: ? ?MMT Right ?09/06/2021 Left ?09/06/2021  ?Hip flexion    ?Hip extension    ?Hip abduction    ?Hip adduction    ?Hip internal rotation    ?Hip external rotation    ?Knee flexion    ?Knee extension    ?Ankle dorsiflexion    ?Ankle plantarflexion    ?Ankle inversion    ?Ankle eversion    ? (Blank rows = not tested) ? ?LOWER EXTREMITY SPECIAL TESTS:  ?{LEspecialtests:26242} ? ?FUNCTIONAL TESTS:  ?{Functional tests:24029} ? ?GAIT: ?Distance walked: *** ?Assistive device utilized: {Assistive devices:23999} ?Level of assistance: {Levels of assistance:24026} ?Comments: *** ? ? ? ?TODAY'S TREATMENT: ?*** ? ? ?PATIENT EDUCATION:  ?Education details: *** ?Person educated: {Person educated:25204} ?Education method: {Education Method:25205} ?Education comprehension: {Education Comprehension:25206} ? ? ?HOME EXERCISE PROGRAM: ?*** ? ?ASSESSMENT: ? ?CLINICAL IMPRESSION: ?Patient is a *** y.o. *** who was seen today for physical therapy evaluation and treatment for ***.  ? ? ?OBJECTIVE IMPAIRMENTS {opptimpairments:25111}.  ? ?ACTIVITY LIMITATIONS {activity limitations:25113}.  ? ?PERSONAL FACTORS {Personal factors:25162} are also affecting patient's functional outcome.  ? ? ?REHAB POTENTIAL: {rehabpotential:25112} ? ?CLINICAL DECISION MAKING: {clinical decision making:25114} ? ?EVALUATION COMPLEXITY: {Evaluation complexity:25115} ? ? ?GOALS: ?Goals reviewed with patient? {yes/no:20286} ? ?SHORT TERM GOALS: Target date: {follow up:25551} ? ?*** ?Baseline: *** ?Goal status: {GOALSTATUS:25110} ? ?2.  *** ?Baseline: *** ?Goal status: {GOALSTATUS:25110} ? ?3.  *** ?Baseline: *** ?  Goal status: {GOALSTATUS:25110} ? ?4.  *** ?Baseline: *** ?Goal status: {GOALSTATUS:25110} ? ?5.  *** ?Baseline: *** ?Goal status: {GOALSTATUS:25110} ? ?6.  *** ?Baseline:  *** ?Goal status: {GOALSTATUS:25110} ? ?LONG TERM GOALS: Target date: {follow up:25551} ? ?*** ?Baseline: *** ?Goal status: {GOALSTATUS:25110} ? ?2.  *** ?Baseline: *** ?Goal status: {GOALSTATUS:25110} ? ?3.  *** ?Baseline: *** ?Goal status: {GOALSTATUS:25110} ? ?4.  *** ?Baseline: *** ?Goal status: {GOALSTATUS:25110} ? ?5.  *** ?Baseline: *** ?Goal status: {GOALSTATUS:25110} ? ?6.  *** ?Baseline: *** ?Goal status: {GOALSTATUS:25110} ? ? ?PLAN: ?PT FREQUENCY: {rehab frequency:25116} ? ?PT DURATION: {rehab duration:25117} ? ?PLANNED INTERVENTIONS: {rehab planned interventions:25118::"Therapeutic exercises","Therapeutic activity","Neuromuscular re-education","Balance training","Gait training","Patient/Family education","Joint mobilization"} ? ?PLAN FOR NEXT SESSION: *** ? ? ?Eloy End, PT ?09/06/2021, 8:34 AM ? ?

## 2021-09-13 ENCOUNTER — Ambulatory Visit: Payer: Medicaid Other | Attending: Orthopedic Surgery

## 2021-09-13 ENCOUNTER — Other Ambulatory Visit: Payer: Self-pay

## 2021-09-13 DIAGNOSIS — M6281 Muscle weakness (generalized): Secondary | ICD-10-CM | POA: Insufficient documentation

## 2021-09-13 DIAGNOSIS — M25561 Pain in right knee: Secondary | ICD-10-CM | POA: Insufficient documentation

## 2021-09-13 DIAGNOSIS — R2681 Unsteadiness on feet: Secondary | ICD-10-CM | POA: Insufficient documentation

## 2021-09-13 DIAGNOSIS — R2689 Other abnormalities of gait and mobility: Secondary | ICD-10-CM | POA: Diagnosis present

## 2021-09-13 NOTE — Therapy (Signed)
?OUTPATIENT PHYSICAL THERAPY LOWER EXTREMITY EVALUATION ? ? ?Patient Name: Gina MemsKimberly Bezek ?MRN: [id number] ?DOB:[birth year], 46 y.o., female ?Today's Date: 09/13/2021 ? ? PT End of Session - 09/13/21 1135   ? ? Visit Number 1   ? Number of Visits 20   ? Date for PT Re-Evaluation 11/22/21   ? Authorization Type [employer] MCD   ? PT Start Time 1001   ? PT Stop Time 1031   ? PT Time Calculation (min) 30 min   ? Activity Tolerance Patient tolerated treatment well   ? [facility] for tasks assessed/performed   ? ?  ?  ? ?  ? ? ?Past Medical History:  ?Diagnosis Date  ? Anxiety   ? Arthritis   ? Depression   ? GERD (gastroesophageal reflux disease)   ? ?Past Surgical History:  ?Procedure Laterality Date  ? CHOLECYSTECTOMY    ? KNEE ARTHROSCOPY WITH LATERAL RELEASE Right 09/03/2021  ? Procedure: KNEE ARTHROSCOPY WITH LATERAL RELEASE, CHONDROPLASTY, DEBRIDEMENT;  Surgeon: Frederico Hammanaffrey, Daniel, MD;  Location: [facility];  Service: Orthopedics;  Laterality: Right;  ? ?There are no problems to display for this patient. ? ? ?PCP: Patient, No Pcp Per (Inactive) ? ?REFERRING PROVIDER: Frederico Hammanaffrey, Daniel, MD ? ?REFERRING DIAG: Post op right knee scope with Lateral release  3.10.23 ? ?THERAPY DIAG:  ?Right knee pain, unspecified chronicity ? ?Muscle weakness (generalized) ? ?Other abnormalities of gait and mobility ? ?Unsteadiness on feet ? ?ONSET DATE: 09/03/2021 ? ?SUBJECTIVE:  ? ?SUBJECTIVE STATEMENT: ?Pt presents s/p R knee scope and lateral release on 09/03/2021. Pt had f/u with MD office on 09/09/2021 with removal of sutures, they noted that she is doing well post surgery. Pt has had increased pain post surgery with standing and walking, but it is slowly becoming less. Pt works at [employer] and had difficulty with ladder navigation pre-surgery secondary to increased R knee pain. Feels like her R knee will buckle on her sometimes, says her MD told her she needs to work on strengthening/. ? ?PERTINENT  HISTORY: ?Anxiety/Depression ? ?PAIN:  ?Are you having pain?  ?Yes: NPRS scale: 5/10 - (10/10 worst; 5/10 best) ?Pain location: R distal lateral quad ?Pain description: tight ?Aggravating factors: prolonged standing, walking ?Relieving factors: rest, ice ? ?PRECAUTIONS: None ? ?WEIGHT BEARING RESTRICTIONS Yes WBAT ? ?FALLS:  ?Has patient fallen in last 6 months? No, Number of falls: N/A ? ?LIVING ENVIRONMENT: ?Lives with: lives with their family ?Lives in: House/apartment ?Stairs: No; N/A ?Has following equipment at home: None ? ?OCCUPATION: works at [employer]; needs to be able to go up/down a ladder ? ?PLOF: Independent and Independent with basic ADLs ? ?PATIENT GOALS: Decrease R knee pain, improve mobility of R knee and R LE ? ? ?OBJECTIVE:  ? ?DIAGNOSTIC FINDINGS:  ? N/A ? ?PATIENT SURVEYS:  ?LEFS 23/80 ? ?COGNITION: ? Overall cognitive status: Within functional limits for tasks assessed   ?  ?SENSATION: ?WFL ? ?POSTURE:  ?Large body habitus, genu recurvatum ? ?PALPATION: ?TTP to R lateral quad ? ?LE ROM: ? ?Active ROM Right ?09/13/2021 Left ?09/13/2021  ?Knee flexion 105 120  ?Knee extension -2 (hyperextension) -3 ?(hyperextension)  ? (Blank rows = not tested) ? ?LE MMT: ? ?MMT Right ?09/13/2021 Left ?09/13/2021  ?Hip flexion 5/5 5/5  ?Hip extension    ?Hip abduction 4/5 5/5  ?Hip adduction    ?Hip internal rotation    ?Hip external rotation    ?Knee flexion 4/5 5/5  ?Knee extension 4/5 5/5  ?Ankle  dorsiflexion    ?Ankle plantarflexion    ?Ankle inversion    ?Ankle eversion    ? (Blank rows = not tested) ? ?LOWER EXTREMITY SPECIAL TESTS:  ?N/A ? ?FUNCTIONAL TESTS:  ?30 Second Sit to Stand: 8 reps with UE support ? ?GAIT: ?Distance walked: 62ft ?Assistive device utilized: None ?Level of assistance: Complete Independence ?Comments: antalgic gait with decreased knee flexion R ? ?TODAY'S TREATMENT: ?[facility] Adult PT Treatment:  DATE: 09/13/2021 ?Therapeutic Exercise: ?Quad set x 5 - 5" hold ?Supine SLR x 5 R ?R heel slide  with strap x 5 - 5" hold ?Sidelying hip abd x 5 R ? ?PATIENT EDUCATION:  ?Education details: eval findings, LEFS, HEP, POC ?Person educated: Patient ?Education method: Explanation, Demonstration, and Handouts ?Education comprehension: verbalized understanding and returned demonstration ? ? ?HOME EXERCISE PROGRAM: ?Access Code: [email or web address] ?URL: [email or web address] ?Date: 09/13/2021 ?Prepared by: Edwinna Areola ? ?Exercises ?Supine Quadricep Sets - 2-3 x daily - 7 x weekly - 2 sets - 10 reps - 5 sec hold ?Active Straight Leg Raise with Quad Set - 2-3 x daily - 7 x weekly - 2 sets - 10 reps ?Supine Heel Slide with Strap - 2-3 x daily - 7 x weekly - 2 sets - 10 reps - 10 sec hold ?Sidelying Hip Abduction - 2 x daily - 7 x weekly - 2 sets - 10 reps ? ?ASSESSMENT: ? ?CLINICAL IMPRESSION: ?Patient is a 46 y.o. F who was seen today for physical therapy evaluation and treatment for s/p R knee scope and lateral release on 09/03/2021. Physical findings are consistent with surgery timeline, as pt demonstrates slight lag in R knee flexion and decreased quad strength. Her LEFS score indicates severe disability in the performance of work related duties, home ADLs, and higher level mobility. She would benefit from skilled PT services post surgery working on normalizing gait, increasing LE strength, and improving functional mobility for anticipated return to work. Will assess HEP response and progress as able.  ? ? ?OBJECTIVE IMPAIRMENTS Abnormal gait, decreased balance, decreased endurance, decreased mobility, difficulty walking, decreased ROM, decreased strength, and pain.  ? ?ACTIVITY LIMITATIONS cleaning, community activity, and occupation.  ? ?PERSONAL FACTORS Fitness and 1-2 comorbidities: anxiety/depression  are also affecting patient's functional outcome.  ? ? ?REHAB POTENTIAL: Excellent ? ?CLINICAL DECISION MAKING: Stable/uncomplicated ? ?EVALUATION COMPLEXITY: Low ? ? ?GOALS: ?Goals reviewed with patient?  No ? ?SHORT TERM GOALS: Target date: 10/04/2021 ? ?Pt will be compliant and knowledgeable with initial HEP for improved comfort and carryover ?Baseline: initial HEP given ?Goal status: INITIAL ? ?2.  Pt will self report R knee pain no greater than 6/10 for improved comfort and functional ability ?Baseline: 10/10 at worst ?Goal status: INITIAL ? ?LONG TERM GOALS: Target date: 11/22/2021 ? ?Pt will self report R knee pain no greater than 2/10 for improved comfort and functional ability ?Baseline: 10/10 at worst ?Goal status: INITIAL ? ?2.  Pt will increase 30 Second Sit to Stand rep count to no less than 11 reps for improved balance, strength, and functional mobility ?Baseline: 8 reps with UE assist  ?Goal status: INITIAL ? ?3.  Pt will increase LEFS functional score to no less than 45/80 for improved functional mobility and ADL performance ?Baseline: 23/80 ?Goal status: INITIAL ? ?4.  Pt will be able to ascend/descend step ladder with no increase in R knee pain and reciprocal motion for improved functional ability with work related duties ?Baseline: unable ?Goal status: INITIAL ? ?5.  Pt will be able  to push/pull sled with 50# with no R knee pain to simulate job related activities at [employer] with improved tolerance  ?Baseline: unable ?Goal status: INITIAL ? ?PLAN: ?PT FREQUENCY: 2x/week ? ?PT DURATION: 10 weeks ? ?PLANNED INTERVENTIONS: Therapeutic exercises, Therapeutic activity, Neuromuscular re-education, Balance training, Gait training, Patient/Family education, Joint mobilization, Electrical stimulation, Cryotherapy, Moist heat, Vasopneumatic device, and Manual therapy ? ?PLAN FOR NEXT SESSION: assess HEP response; gait training, quad and hip strengthening  ? ?Check all possible CPT codes: 17494- Therapeutic Exercise, [phone]- Neuro Re-education, [phone] - Gait Training, [phone] - Manual Therapy, 97530 - Therapeutic Activities, 97535 - Self Care, 97014 - Electrical stimulation (unattended), [id number] - Electrical  stimulation (Manual), and [id number] - Vaso    ? ?If treatment provided at initial evaluation, no treatment charged due to lack of authorization.    ? ?Eloy End, PT ?09/13/2021, 11:36 AM ? ?

## 2021-09-15 ENCOUNTER — Other Ambulatory Visit: Payer: Self-pay

## 2021-09-15 ENCOUNTER — Ambulatory Visit: Payer: Medicaid Other

## 2021-09-15 DIAGNOSIS — R2689 Other abnormalities of gait and mobility: Secondary | ICD-10-CM

## 2021-09-15 DIAGNOSIS — M25561 Pain in right knee: Secondary | ICD-10-CM

## 2021-09-15 DIAGNOSIS — R2681 Unsteadiness on feet: Secondary | ICD-10-CM

## 2021-09-15 DIAGNOSIS — M6281 Muscle weakness (generalized): Secondary | ICD-10-CM

## 2021-09-15 NOTE — Therapy (Signed)
?OUTPATIENT PHYSICAL THERAPY TREATMENT NOTE ? ? ?Patient Name: Gina Patel ?MRN: [id number] ?DOB:[birth year], 46 y.o., female ?Today's Date: 09/15/2021 ? ?PCP: Patient, No Pcp Per (Inactive) ?REFERRING PROVIDER: Frederico Hamman, MD ? ? PT End of Session - 09/15/21 1000   ? ? Visit Number 2   ? Number of Visits 20   ? Date for PT Re-Evaluation 11/22/21   ? Authorization Type [employer] MCD   ? PT Start Time 1000   ? PT Stop Time 1042   ? PT Time Calculation (min) 42 min   ? Activity Tolerance Patient tolerated treatment well   ? Behavior During Therapy [facility] for tasks assessed/performed   ? ?  ?  ? ?  ? ? ?Past Medical History:  ?Diagnosis Date  ? Anxiety   ? Arthritis   ? Depression   ? GERD (gastroesophageal reflux disease)   ? ?Past Surgical History:  ?Procedure Laterality Date  ? CHOLECYSTECTOMY    ? KNEE ARTHROSCOPY WITH LATERAL RELEASE Right 09/03/2021  ? Procedure: KNEE ARTHROSCOPY WITH LATERAL RELEASE, CHONDROPLASTY, DEBRIDEMENT;  Surgeon: Frederico Hamman, MD;  Location: [facility];  Service: Orthopedics;  Laterality: Right;  ? ?There are no problems to display for this patient. ? ? ?REFERRING DIAG: Post op right knee scope with Lateral release  3.10.23 ? ?THERAPY DIAG:  ?Right knee pain, unspecified chronicity ? ?Muscle weakness (generalized) ? ?Other abnormalities of gait and mobility ? ?Unsteadiness on feet ? ?PERTINENT HISTORY: Anxiety/Depression ? ?PRECAUTIONS: None ? ?ONSET DATE: 09/03/2021 ? ?WEIGHT BEARING RESTRICTIONS Yes WBAT ? ?SUBJECTIVE: I'm feeling ok, my knee hurts today, I think it's all the rain today. ? ?PAIN:  ?Are you having pain?  Yes ?NPRS scale: 5/10 - (10/10 worst; 2/10 best) ?Pain location: R distal lateral quad ?Pain description: tight ?Aggravating factors: prolonged standing, walking ?Relieving factors: rest, ice ? ? ?  ?OBJECTIVE:  ?  ?DIAGNOSTIC FINDINGS:  ?          N/A ?  ?PATIENT SURVEYS:  ?LEFS 23/80 ?  ?COGNITION: ?          Overall cognitive status: Within functional limits for tasks  assessed               ?           ?SENSATION: ?WFL ?  ?POSTURE:  ?Large body habitus, genu recurvatum ?  ?PALPATION: ?TTP to R lateral quad ?  ?LE ROM: ?  ?Active ROM Right ?09/13/2021 Left ?09/13/2021  ?Knee flexion 105 120  ?Knee extension -2 (hyperextension) -3 ?(hyperextension)  ? (Blank rows = not tested) ?  ?LE MMT: ?  ?MMT Right ?09/13/2021 Left ?09/13/2021  ?Hip flexion 5/5 5/5  ?Hip extension      ?Hip abduction 4/5 5/5  ?Hip adduction      ?Hip internal rotation      ?Hip external rotation      ?Knee flexion 4/5 5/5  ?Knee extension 4/5 5/5  ?Ankle dorsiflexion      ?Ankle plantarflexion      ?Ankle inversion      ?Ankle eversion      ? (Blank rows = not tested) ?  ?LOWER EXTREMITY SPECIAL TESTS:  ?N/A ?  ?FUNCTIONAL TESTS:  ?30 Second Sit to Stand: 8 reps with UE support ?  ?GAIT: ?Distance walked: 32ft ?Assistive device utilized: None ?Level of assistance: Complete Independence ?Comments: antalgic gait with decreased knee flexion R ?  ?TODAY'S TREATMENT: ?[facility] Adult PT Treatment:  DATE: 09/14/2021 ?Therapeutic Exercise: ?Nustep level 5 x 5 mins ?SLR with QS 2x10 ?Heel slide with strap x10 ?Standing march 3# 2x10  ?Standing hip abduction 3# 2x10 ?Standing hamstring curl 3# 2x10 ?Step ups forward/lateral 2x10 each R leading (cues to not hyperextend) ?Heel taps with R on 2" step (cues to not hyperextend) ?Heel raises 2x15 ?STS 2x10 no UE support ?Sled push 20# 5x20" forwards/backwards ?Modalities: ?Ice with pt in supine and legs elevated x8 mins post session ? ? ?[facility] Adult PT Treatment:  DATE: 09/13/2021 ?Therapeutic Exercise: ?Quad set x 5 - 5" hold ?Supine SLR x 5 R ?R heel slide with strap x 5 - 5" hold ?Sidelying hip abd x 5 R ?  ?PATIENT EDUCATION:  ?Education details: eval findings, LEFS, HEP, POC ?Person educated: Patient ?Education method: Explanation, Demonstration, and Handouts ?Education comprehension: verbalized understanding and returned demonstration ?   ?  ?HOME EXERCISE PROGRAM: ?Access Code: [email or web address] ?URL: [email or web address] ?Date: 09/13/2021 ?Prepared by: Edwinna Areola ?  ?Exercises ?Supine Quadricep Sets - 2-3 x daily - 7 x weekly - 2 sets - 10 reps - 5 sec hold ?Active Straight Leg Raise with Quad Set - 2-3 x daily - 7 x weekly - 2 sets - 10 reps ?Supine Heel Slide with Strap - 2-3 x daily - 7 x weekly - 2 sets - 10 reps - 10 sec hold ?Sidelying Hip Abduction - 2 x daily - 7 x weekly - 2 sets - 10 reps ?  ?ASSESSMENT: ?  ?CLINICAL IMPRESSION: ?Patient presents to PT with moderate levels of pain in her R knee, stating it has been severe in the past few days. She is mostly still concerned with walking for distances, stating it feels like her knee is going to "give out." She is eager to return to work. Session today focused on improving quad and proximal hip strength as well as education on not hyperextending BIL knees. She was able to tolerate all exercises today within her pain tolerance and with no adverse effects. Patient continues to benefit from skilled PT services and should be progressed as able to improve functional independence. ? ?  ?  ?OBJECTIVE IMPAIRMENTS Abnormal gait, decreased balance, decreased endurance, decreased mobility, difficulty walking, decreased ROM, decreased strength, and pain.  ?  ?ACTIVITY LIMITATIONS cleaning, community activity, and occupation.  ?  ?PERSONAL FACTORS Fitness and 1-2 comorbidities: anxiety/depression  are also affecting patient's functional outcome.  ?  ?  ?REHAB POTENTIAL: Excellent ?  ?CLINICAL DECISION MAKING: Stable/uncomplicated ?  ?EVALUATION COMPLEXITY: Low ?  ?  ?GOALS: ?Goals reviewed with patient? No ?  ?SHORT TERM GOALS: Target date: 10/04/2021 ?  ?Pt will be compliant and knowledgeable with initial HEP for improved comfort and carryover ?Baseline: initial HEP given ?Goal status: INITIAL ?  ?2.  Pt will self report R knee pain no greater than 6/10 for improved comfort and functional  ability ?Baseline: 10/10 at worst ?Goal status: INITIAL ?  ?LONG TERM GOALS: Target date: 11/22/2021 ?  ?Pt will self report R knee pain no greater than 2/10 for improved comfort and functional ability ?Baseline: 10/10 at worst ?Goal status: INITIAL ?  ?2.  Pt will increase 30 Second Sit to Stand rep count to no less than 11 reps for improved balance, strength, and functional mobility ?Baseline: 8 reps with UE assist  ?Goal status: INITIAL ?  ?3.  Pt will increase LEFS functional score to no less than 45/80 for improved functional mobility and ADL performance ?Baseline: 23/80 ?Goal status:  INITIAL ?  ?4.  Pt will be able to ascend/descend step ladder with no increase in R knee pain and reciprocal motion for improved functional ability with work related duties ?Baseline: unable ?Goal status: INITIAL ?  ?5.  Pt will be able to push/pull sled with 50# with no R knee pain to simulate job related activities at [employer] with improved tolerance  ?Baseline: unable ?Goal status: INITIAL ?  ?PLAN: ?PT FREQUENCY: 2x/week ?  ?PT DURATION: 10 weeks ?  ?PLANNED INTERVENTIONS: Therapeutic exercises, Therapeutic activity, Neuromuscular re-education, Balance training, Gait training, Patient/Family education, Joint mobilization, Electrical stimulation, Cryotherapy, Moist heat, Vasopneumatic device, and Manual therapy ?  ?PLAN FOR NEXT SESSION: assess HEP response; gait training, quad and hip strengthening  ? ? ? ?Harland GermanStephanie Edwena Mayorga, PTA ?09/15/2021, 10:43 AM ? ?  ? ?

## 2021-09-20 ENCOUNTER — Ambulatory Visit: Payer: Medicaid Other

## 2021-09-20 ENCOUNTER — Other Ambulatory Visit: Payer: Self-pay

## 2021-09-20 DIAGNOSIS — M25561 Pain in right knee: Secondary | ICD-10-CM

## 2021-09-20 DIAGNOSIS — M6281 Muscle weakness (generalized): Secondary | ICD-10-CM

## 2021-09-20 DIAGNOSIS — R2689 Other abnormalities of gait and mobility: Secondary | ICD-10-CM

## 2021-09-20 DIAGNOSIS — R2681 Unsteadiness on feet: Secondary | ICD-10-CM

## 2021-09-20 NOTE — Therapy (Signed)
?OUTPATIENT PHYSICAL THERAPY TREATMENT NOTE ? ? ?Patient Name: Gina Patel ?MRN: [id number] ?DOB:02-14-1976, 46 y.o., female ?Today's Date: 09/20/2021 ? ?PCP: Patient, No Pcp Per (Inactive) ?REFERRING PROVIDER: Earlie Server, MD ? ? PT End of Session - 09/20/21 1000   ? ? Visit Number 3   ? Number of Visits 20   ? Date for PT Re-Evaluation 11/22/21   ? Authorization Type [employer] MCD   ? PT Start Time 1000   ? PT Stop Time 1041   ? PT Time Calculation (min) 41 min   ? Activity Tolerance Patient tolerated treatment well   ? Behavior During Therapy [facility] for tasks assessed/performed   ? ?  ?  ? ?  ? ? ? ?Past Medical History:  ?Diagnosis Date  ? Anxiety   ? Arthritis   ? Depression   ? GERD (gastroesophageal reflux disease)   ? ?Past Surgical History:  ?Procedure Laterality Date  ? CHOLECYSTECTOMY    ? KNEE ARTHROSCOPY WITH LATERAL RELEASE Right 09/03/2021  ? Procedure: KNEE ARTHROSCOPY WITH LATERAL RELEASE, CHONDROPLASTY, DEBRIDEMENT;  Surgeon: Earlie Server, MD;  Location: [facility];  Service: Orthopedics;  Laterality: Right;  ? ?There are no problems to display for this patient. ? ? ?REFERRING DIAG: Post op right knee scope with Lateral release  3.10.23 ? ?THERAPY DIAG:  ?Right knee pain, unspecified chronicity ? ?Muscle weakness (generalized) ? ?Other abnormalities of gait and mobility ? ?Unsteadiness on feet ? ?PERTINENT HISTORY: Anxiety/Depression ? ?PRECAUTIONS: None ? ?ONSET DATE: 09/03/2021 ? ?WEIGHT BEARING RESTRICTIONS Yes WBAT ? ?SUBJECTIVE:  ?Pt presents to PT with reports of increased pain in R LE after hurting herself rising quickly from sitting to standing over the weekend. She iced and elevated her R LE all weekend, but has continued pain. Pt is ready to begin PT at this time. ? ?PAIN:  ?Are you having pain?  Yes ?NPRS scale: 8/10 - (10/10 worst; 2/10 best) ?Pain location: R distal lateral quad ?Pain description: tight ?Aggravating factors: prolonged standing, walking ?Relieving factors: rest, ice ? ? ?   ?OBJECTIVE:  ?  ?DIAGNOSTIC FINDINGS:  ?          N/A ?  ?PATIENT SURVEYS:  ?LEFS 23/80 ?  ?COGNITION: ?          Overall cognitive status: Within functional limits for tasks assessed               ?           ?SENSATION: ?WFL ?  ?POSTURE:  ?Large body habitus, genu recurvatum ?  ?PALPATION: ?TTP to R lateral quad ?  ?LE ROM: ?  ?Active ROM Right ?09/13/2021 Left ?09/13/2021  ?Knee flexion 105 120  ?Knee extension -2 (hyperextension) -3 ?(hyperextension)  ? (Blank rows = not tested) ?  ?LE MMT: ?  ?MMT Right ?09/13/2021 Left ?09/13/2021  ?Hip flexion 5/5 5/5  ?Hip extension      ?Hip abduction 4/5 5/5  ?Hip adduction      ?Hip internal rotation      ?Hip external rotation      ?Knee flexion 4/5 5/5  ?Knee extension 4/5 5/5  ?Ankle dorsiflexion      ?Ankle plantarflexion      ?Ankle inversion      ?Ankle eversion      ? (Blank rows = not tested) ?  ?LOWER EXTREMITY SPECIAL TESTS:  ?N/A ?  ?FUNCTIONAL TESTS:  ?30 Second Sit to Stand: 8 reps with UE support ?  ?GAIT: ?Distance walked: 52ft ?  Assistive device utilized: None ?Level of assistance: Complete Independence ?Comments: antalgic gait with decreased knee flexion R ?  ?TODAY'S TREATMENT: ?[facility] Adult PT Treatment:                                                DATE: 09/20/2021 ?Therapeutic Exercise: ?Nustep level 5 x 5 mins LE only while taking subjective ?Rolling out R quad x 2 min ?Supine quad set x 10 - 5" hold ?SAQ 2x10 R ?SLR with QS 2x10 R ?Standing TKE with ball 2x10 - 5" hold ?Step up 8in step 2x10 fwd/lat ?Lateral walk RTB x 3 laps in // ?Standing hamstring curl 3# 2x15  ?Standing hip abd 2x10 3# ?Sled push/pull 30# 5x39ft ? ?[facility] Adult PT Treatment:                                                DATE: 09/14/2021 ?Therapeutic Exercise: ?Nustep level 5 x 5 mins ?SLR with QS 2x10 ?Heel slide with strap x10 ?Standing march 3# 2x10  ?Standing hip abduction 3# 2x10 ?Standing hamstring curl 3# 2x10 ?Step ups forward/lateral 2x10 each R leading (cues to not  hyperextend) ?Heel taps with R on 2" step (cues to not hyperextend) ?Heel raises 2x15 ?STS 2x10 no UE support ?Sled push 20# 5x20" forwards/backwards ?Modalities: ?Ice with pt in supine and legs elevated x8 mins post session ? ? ?[facility] Adult PT Treatment:  DATE: 09/13/2021 ?Therapeutic Exercise: ?Quad set x 5 - 5" hold ?Supine SLR x 5 R ?R heel slide with strap x 5 - 5" hold ?Sidelying hip abd x 5 R ?  ?PATIENT EDUCATION:  ?Education details: eval findings, LEFS, HEP, POC ?Person educated: Patient ?Education method: Explanation, Demonstration, and Handouts ?Education comprehension: verbalized understanding and returned demonstration ?  ?  ?HOME EXERCISE PROGRAM: ?Access Code: [email or web address] ?URL: [email or web address] ?Date: 09/13/2021 ?Prepared by: Octavio Manns ?  ?Exercises ?Supine Quadricep Sets - 2-3 x daily - 7 x weekly - 2 sets - 10 reps - 5 sec hold ?Active Straight Leg Raise with Quad Set - 2-3 x daily - 7 x weekly - 2 sets - 10 reps ?Supine Heel Slide with Strap - 2-3 x daily - 7 x weekly - 2 sets - 10 reps - 10 sec hold ?Sidelying Hip Abduction - 2 x daily - 7 x weekly - 2 sets - 10 reps ?  ?ASSESSMENT: ?  ?CLINICAL IMPRESSION: ?Pt was able to complete all prescribed exercises with no adverse effect today, despite increased baseline R quad pain at start of therapy. Today therapy focused on improving quad and proximal hip strength post surgery in order to decrease pain and improve functional ability. She continues to require skilled PT services working on improving strength and function for eventual return to work. Will continue to progress as tolerated per POC.  ?  ?  ?OBJECTIVE IMPAIRMENTS Abnormal gait, decreased balance, decreased endurance, decreased mobility, difficulty walking, decreased ROM, decreased strength, and pain.  ?  ?ACTIVITY LIMITATIONS cleaning, community activity, and occupation.  ?  ?PERSONAL FACTORS Fitness and 1-2 comorbidities: anxiety/depression  are also affecting patient's  functional outcome.  ?  ?  ?REHAB POTENTIAL: Excellent ?  ?CLINICAL DECISION MAKING: Stable/uncomplicated ?  ?EVALUATION COMPLEXITY: Low ?  ?  ?  GOALS: ?Goals reviewed with patient? No ?  ?SHORT TERM GOALS: Target date: 10/04/2021 ?  ?Pt will be compliant and knowledgeable with initial HEP for improved comfort and carryover ?Baseline: initial HEP given ?Goal status: INITIAL ?  ?2.  Pt will self report R knee pain no greater than 6/10 for improved comfort and functional ability ?Baseline: 10/10 at worst ?Goal status: INITIAL ?  ?LONG TERM GOALS: Target date: 11/22/2021 ?  ?Pt will self report R knee pain no greater than 2/10 for improved comfort and functional ability ?Baseline: 10/10 at worst ?Goal status: INITIAL ?  ?2.  Pt will increase 30 Second Sit to Stand rep count to no less than 11 reps for improved balance, strength, and functional mobility ?Baseline: 8 reps with UE assist  ?Goal status: INITIAL ?  ?3.  Pt will increase LEFS functional score to no less than 45/80 for improved functional mobility and ADL performance ?Baseline: 23/80 ?Goal status: INITIAL ?  ?4.  Pt will be able to ascend/descend step ladder with no increase in R knee pain and reciprocal motion for improved functional ability with work related duties ?Baseline: unable ?Goal status: INITIAL ?  ?5.  Pt will be able to push/pull sled with 50# with no R knee pain to simulate job related activities at [employer] with improved tolerance  ?Baseline: unable ?Goal status: INITIAL ?  ?PLAN: ?PT FREQUENCY: 2x/week ?  ?PT DURATION: 10 weeks ?  ?PLANNED INTERVENTIONS: Therapeutic exercises, Therapeutic activity, Neuromuscular re-education, Balance training, Gait training, Patient/Family education, Joint mobilization, Electrical stimulation, Cryotherapy, Moist heat, Vasopneumatic device, and Manual therapy ?  ?PLAN FOR NEXT SESSION: assess HEP response; gait training, quad and hip strengthening  ? ? ? ?Ward Chatters, PT ?09/20/2021, 10:41 AM ? ?  ? ?

## 2021-09-21 NOTE — Therapy (Incomplete)
?OUTPATIENT PHYSICAL THERAPY TREATMENT NOTE ? ? ?Patient Name: Gina Patel ?MRN: [id number] ?DOB:[birth year], 46 y.o., female ?Today's Date: 09/21/2021 ? ?PCP: Patient, No Pcp Per (Inactive) ?REFERRING PROVIDER: Frederico Hamman, MD ? ? ? ? ? ?Past Medical History:  ?Diagnosis Date  ? Anxiety   ? Arthritis   ? Depression   ? GERD (gastroesophageal reflux disease)   ? ?Past Surgical History:  ?Procedure Laterality Date  ? CHOLECYSTECTOMY    ? KNEE ARTHROSCOPY WITH LATERAL RELEASE Right 09/03/2021  ? Procedure: KNEE ARTHROSCOPY WITH LATERAL RELEASE, CHONDROPLASTY, DEBRIDEMENT;  Surgeon: Frederico Hamman, MD;  Location: [facility];  Service: Orthopedics;  Laterality: Right;  ? ?There are no problems to display for this patient. ? ? ?REFERRING DIAG: Post op right knee scope with Lateral release  3.10.23 ? ?THERAPY DIAG:  ?No diagnosis found. ? ?PERTINENT HISTORY: Anxiety/Depression ? ?PRECAUTIONS: None ? ?ONSET DATE: 09/03/2021 ? ?WEIGHT BEARING RESTRICTIONS Yes WBAT ? ?SUBJECTIVE: *** ?Pt presents to PT with reports of increased pain in R LE after hurting herself rising quickly from sitting to standing over the weekend. She iced and elevated her R LE all weekend, but has continued pain. Pt is ready to begin PT at this time. ? ?PAIN:  ?Are you having pain?  Yes ?NPRS scale: ***/10 - (10/10 worst; 2/10 best) ?Pain location: R distal lateral quad ?Pain description: tight ?Aggravating factors: prolonged standing, walking ?Relieving factors: rest, ice ? ? ?  ?OBJECTIVE:  ?  ?DIAGNOSTIC FINDINGS:  ?          N/A ?  ?PATIENT SURVEYS:  ?LEFS 23/80 ?  ?COGNITION: ?          Overall cognitive status: Within functional limits for tasks assessed               ?           ?SENSATION: ?WFL ?  ?POSTURE:  ?Large body habitus, genu recurvatum ?  ?PALPATION: ?TTP to R lateral quad ?  ?LE ROM: ?  ?Active ROM Right ?09/13/2021 Left ?09/13/2021  ?Knee flexion 105 120  ?Knee extension -2 (hyperextension) -3 ?(hyperextension)  ? (Blank rows = not  tested) ?  ?LE MMT: ?  ?MMT Right ?09/13/2021 Left ?09/13/2021  ?Hip flexion 5/5 5/5  ?Hip extension      ?Hip abduction 4/5 5/5  ?Hip adduction      ?Hip internal rotation      ?Hip external rotation      ?Knee flexion 4/5 5/5  ?Knee extension 4/5 5/5  ?Ankle dorsiflexion      ?Ankle plantarflexion      ?Ankle inversion      ?Ankle eversion      ? (Blank rows = not tested) ?  ?LOWER EXTREMITY SPECIAL TESTS:  ?N/A ?  ?FUNCTIONAL TESTS:  ?30 Second Sit to Stand: 8 reps with UE support ?  ?GAIT: ?Distance walked: 56ft ?Assistive device utilized: None ?Level of assistance: Complete Independence ?Comments: antalgic gait with decreased knee flexion R ?  ?TODAY'S TREATMENT: ?[facility] Adult PT Treatment:                                                DATE: 09/22/2021 ?Therapeutic Exercise: ?Nustep level 5 x 5 mins LE only while taking subjective ?Supine quad set x 10 - 5" hold ?SAQ 2x10 R ?SLR with QS 2x10 R ?Standing TKE  with ball 2x10 - 5" hold ?Step up 8in step 2x10 fwd/lat ?Heel taps with R on 2" step (cues to not hyperextend) ?Lateral walk RTB x 3 laps at counter ?Standing hamstring curl 3# 2x15  ?Standing hip abd 2x10 3# ?Sled push/pull 30# 5x3020ft ?Modalities: ?Ice with pt in supine and legs elevated x8 mins post session ? ? ?[facility] Adult PT Treatment:                                                DATE: 09/20/2021 ?Therapeutic Exercise: ?Nustep level 5 x 5 mins LE only while taking subjective ?Rolling out R quad x 2 min ?Supine quad set x 10 - 5" hold ?SAQ 2x10 R ?SLR with QS 2x10 R ?Standing TKE with ball 2x10 - 5" hold ?Step up 8in step 2x10 fwd/lat ?Lateral walk RTB x 3 laps in // ?Standing hamstring curl 3# 2x15  ?Standing hip abd 2x10 3# ?Sled push/pull 30# 5x6520ft ? ?[facility]                                                [facility]: 09[facility]14/[facility]21 ?Therapeutic Exercise: ?Nustep level 5 x 5 mins ?SLR with QS 2x10 ?Heel slide with strap x10 ?Standing march 3# 2x10  ?Standing hip abduction 3# 2x10 ?Standing hamstring  curl 3# 2x10 ?Step ups forward/lateral 2x10 each R leading (cues to not hyperextend) ?Heel taps with R on 2" step (cues to not hyperextend) ?Heel raises 2x15 ?STS 2x10 no UE support ?Sled push 20# 5x20" forwards/backwards ?Modalities: ?Ice with pt in supine and legs elevated x8 mins post session ? ?  ?PATIENT EDUCATION:  ?Education details: eval findings, LEFS, HEP, POC ?Person educated: Patient ?Education method: Explanation, Demonstration, and Handouts ?Education comprehension: verbalized understanding and returned demonstration ?  ?  ?HOME EXERCISE PROGRAM: ?Access Code: [email or web address] ?URL: [email or web address] ?Date: 09/13/2021 ?Prepared by: Edwinna Areolaavid Stroup ?  ?Exercises ?Supine Quadricep Sets - 2-3 x daily - 7 x weekly - 2 sets - 10 reps - 5 sec hold ?Active Straight Leg Raise with Quad Set - 2-3 x daily - 7 x weekly - 2 sets - 10 reps ?Supine Heel Slide with Strap - 2-3 x daily - 7 x weekly - 2 sets - 10 reps - 10 sec hold ?Sidelying Hip Abduction - 2 x daily - 7 x weekly - 2 sets - 10 reps ?  ?ASSESSMENT: ?  ?CLINICAL IMPRESSION: ?*** ? ?Pt was able to complete all prescribed exercises with no adverse effect today, despite increased baseline R quad pain at start of therapy. Today therapy focused on improving quad and proximal hip strength post surgery in order to decrease pain and improve functional ability. She continues to require skilled PT services working on improving strength and function for eventual return to work. Will continue to progress as tolerated per POC.  ?  ?  ?OBJECTIVE IMPAIRMENTS Abnormal gait, decreased balance, decreased endurance, decreased mobility, difficulty walking, decreased ROM, decreased strength, and pain.  ?  ?ACTIVITY LIMITATIONS cleaning, community activity, and occupation.  ?  ?PERSONAL FACTORS Fitness and 1-2 comorbidities: anxiety/depression  are also affecting patient's functional outcome.  ?  ?  ?REHAB POTENTIAL: Excellent ?  ?CLINICAL DECISION MAKING:  Stable/uncomplicated ?  ?  EVALUATION COMPLEXITY: Low ?  ?  ?GOALS: ?Goals reviewed with patient? No ?  ?SHORT TERM GOALS: Target date: 10/04/2021 ?  ?Pt will be compliant and knowledgeable with initial HEP for improved comfort and carryover ?Baseline: initial HEP given ?Goal status: INITIAL ?  ?2.  Pt will self report R knee pain no greater than 6/10 for improved comfort and functional ability ?Baseline: 10/10 at worst ?Goal status: INITIAL ?  ?LONG TERM GOALS: Target date: 11/22/2021 ?  ?Pt will self report R knee pain no greater than 2/10 for improved comfort and functional ability ?Baseline: 10/10 at worst ?Goal status: INITIAL ?  ?2.  Pt will increase 30 Second Sit to Stand rep count to no less than 11 reps for improved balance, strength, and functional mobility ?Baseline: 8 reps with UE assist  ?Goal status: INITIAL ?  ?3.  Pt will increase LEFS functional score to no less than 45/80 for improved functional mobility and ADL performance ?Baseline: 23/80 ?Goal status: INITIAL ?  ?4.  Pt will be able to ascend/descend step ladder with no increase in R knee pain and reciprocal motion for improved functional ability with work related duties ?Baseline: unable ?Goal status: INITIAL ?  ?5.  Pt will be able to push/pull sled with 50# with no R knee pain to simulate job related activities at [employer] with improved tolerance  ?Baseline: unable ?Goal status: INITIAL ?  ?PLAN: ?PT FREQUENCY: 2x/week ?  ?PT DURATION: 10 weeks ?  ?PLANNED INTERVENTIONS: Therapeutic exercises, Therapeutic activity, Neuromuscular re-education, Balance training, Gait training, Patient/Family education, Joint mobilization, Electrical stimulation, Cryotherapy, Moist heat, Vasopneumatic device, and Manual therapy ?  ?PLAN FOR NEXT SESSION: assess HEP response; gait training, quad and hip strengthening  ? ? ? ?Harland German, PTA ?09/21/2021, 10:36 AM ? ?  ? ?

## 2021-09-22 ENCOUNTER — Ambulatory Visit: Payer: Medicaid Other

## 2021-09-27 ENCOUNTER — Ambulatory Visit: Payer: Medicaid Other | Attending: Orthopedic Surgery

## 2021-09-27 DIAGNOSIS — M6281 Muscle weakness (generalized): Secondary | ICD-10-CM | POA: Insufficient documentation

## 2021-09-27 DIAGNOSIS — M25561 Pain in right knee: Secondary | ICD-10-CM | POA: Insufficient documentation

## 2021-09-27 DIAGNOSIS — R2681 Unsteadiness on feet: Secondary | ICD-10-CM | POA: Diagnosis present

## 2021-09-27 DIAGNOSIS — R2689 Other abnormalities of gait and mobility: Secondary | ICD-10-CM | POA: Diagnosis present

## 2021-09-27 NOTE — Therapy (Signed)
?OUTPATIENT PHYSICAL THERAPY TREATMENT NOTE ? ? ?Patient Name: Gina Patel ?MRN: [id number] ?DOB:February 15, 1976, 46 y.o., female ?Today's Date: 09/27/2021 ? ?PCP: Patient, No Pcp Per (Inactive) ?REFERRING PROVIDER: Frederico Hamman, MD ? ? PT End of Session - 09/27/21 1003   ? ? Visit Number 4   ? Number of Visits 20   ? Date for PT Re-Evaluation 11/22/21   ? Authorization Type [employer] MCD   ? PT Start Time 1004   ? PT Stop Time 1042   ? PT Time Calculation (min) 38 min   ? Activity Tolerance Patient tolerated treatment well   ? Behavior During Therapy [facility] for tasks assessed/performed   ? ?  ?  ? ?  ? ? ? ? ?Past Medical History:  ?Diagnosis Date  ? Anxiety   ? Arthritis   ? Depression   ? GERD (gastroesophageal reflux disease)   ? ?Past Surgical History:  ?Procedure Laterality Date  ? CHOLECYSTECTOMY    ? KNEE ARTHROSCOPY WITH LATERAL RELEASE Right 09/03/2021  ? Procedure: KNEE ARTHROSCOPY WITH LATERAL RELEASE, CHONDROPLASTY, DEBRIDEMENT;  Surgeon: Frederico Hamman, MD;  Location: [facility];  Service: Orthopedics;  Laterality: Right;  ? ?There are no problems to display for this patient. ? ? ?REFERRING DIAG: Post op right knee scope with Lateral release  3.10.23 ? ?THERAPY DIAG:  ?Right knee pain, unspecified chronicity ? ?Muscle weakness (generalized) ? ?PERTINENT HISTORY: Anxiety/Depression ? ?PRECAUTIONS: None ? ?ONSET DATE: 09/03/2021 ? ?WEIGHT BEARING RESTRICTIONS Yes WBAT ? ?SUBJECTIVE:  ?Pt presents to PT with reports of decreased R LE pain since her last session. She has had to continue to take medication to manage pain. Pt is ready to begin PT at this time.  ? ?PAIN:  ?Are you having pain?  Yes ?NPRS scale: 5/10 - (10/10 worst; 2/10 best) ?Pain location: R distal lateral quad ?Pain description: tight ?Aggravating factors: prolonged standing, walking ?Relieving factors: rest, ice ? ? ?  ?OBJECTIVE:  ?  ?DIAGNOSTIC FINDINGS:  ?          N/A ?  ?PATIENT SURVEYS:  ?LEFS 23/80 ?  ?COGNITION: ?          Overall cognitive  status: Within functional limits for tasks assessed               ?           ?SENSATION: ?WFL ?  ?POSTURE:  ?Large body habitus, genu recurvatum ?  ?PALPATION: ?TTP to R lateral quad ?  ?LE ROM: ?  ?Active ROM Right ?09/13/2021 Left ?09/13/2021  ?Knee flexion 105 120  ?Knee extension -2 (hyperextension) -3 ?(hyperextension)  ? (Blank rows = not tested) ?  ?LE MMT: ?  ?MMT Right ?09/13/2021 Left ?09/13/2021  ?Hip flexion 5/5 5/5  ?Hip extension      ?Hip abduction 4/5 5/5  ?Hip adduction      ?Hip internal rotation      ?Hip external rotation      ?Knee flexion 4/5 5/5  ?Knee extension 4/5 5/5  ?Ankle dorsiflexion      ?Ankle plantarflexion      ?Ankle inversion      ?Ankle eversion      ? (Blank rows = not tested) ?  ?LOWER EXTREMITY SPECIAL TESTS:  ?N/A ?  ?FUNCTIONAL TESTS:  ?30 Second Sit to Stand: 8 reps with UE support ?  ?GAIT: ?Distance walked: 73ft ?Assistive device utilized: None ?Level of assistance: Complete Independence ?Comments: antalgic gait with decreased knee flexion R ?  ?TOD[facility]'S  TREATMENT[facility] ?[facility]                                                DATE: 09/27/2021 ?Therapeutic Exercise: ?NuStep lvl 5 x 5 min LE only while taking subjective ?Supine quad set x 10 - 5" hold ?SLR with QS 2x10 R ?Supine heel slide with strap x 10 - 5" hold R ?LAQ 2x10 2.5# R ?Standing TKE with ball 20 - 5" hold ?Step up 8in step x 10 fwd/lat ?Lateral walk RTB x 3 laps in // ?Standing hip abd/ext 2x10 each RTB ?Sled push/pull 600# 4x2520ft ? ?[facility]:                                                DATE: 09/20/2021 ?Therapeutic Exercise: ?Nustep level 5 x 5 mins LE only while taking subjective ?Rolling out R quad x 2 min ?Supine quad set x 10 - 5" hold ?SAQ 2x10 R ?SLR with QS 2x10 R ?Standing TKE with ball 2x10 - 5" hold ?Step up 8in step 2x10 fwd/lat ?Lateral walk RTB x 3 laps in // ?Standing hamstring curl 3# 2x15  ?Standing hip abd 2x10 3# ?Sled push/pull 30# 5x5920ft ? ?[facility]                                                 DATE: 09/14/2021 ?Therapeutic Exercise: ?Nustep level 5 x 5 mins ?SLR with QS 2x10 ?Heel slide with strap x10 ?Standing march 3# 2x10  ?Standing hip abduction 3# 2x10 ?Standing hamstring curl 3# 2x10 ?Step ups forward/lateral 2x10 each R leading (cues to not hyperextend) ?Heel taps with R on 2" step (cues to not hyperextend) ?Heel raises 2x15 ?STS 2x10 no UE support ?Sled push 20# 5x20" forwards/backwards ?Modalities: ?Ice with pt in supine and legs elevated x8 mins post session ?  ?PATIENT EDUCATION:  ?Education details: eval findings, LEFS, HEP, POC ?Person educated: Patient ?Education method: Explanation, Demonstration, and Handouts ?Education comprehension: verbalized understanding and returned demonstration ?  ?  ?HOME EXERCISE PROGRAM: ?Access Code: [email or web address] ?URL: [email or web address] ?Date: 09/13/2021 ?Prepared by: Edwinna Areolaavid Oliveah Zwack ?  ?Exercises ?Supine Quadricep Sets - 2-3 x daily - 7 x weekly - 2 sets - 10 reps - 5 sec hold ?Active Straight Leg Raise with Quad Set - 2-3 x daily - 7 x weekly - 2 sets - 10 reps ?Supine Heel Slide with Strap - 2-3 x daily - 7 x weekly - 2 sets - 10 reps - 10 sec hold ?Sidelying Hip Abduction - 2 x daily - 7 x weekly - 2 sets - 10 reps ?  ?ASSESSMENT: ?  ?CLINICAL IMPRESSION: ?Pt was able to complete all prescribed exercises with no adverse effect. Therapy today continued to focus on improving quad strength and functional ability. She is progressing as expected with therapy and continues to benefit from skilled PT services. PT will continue to progress as tolerated per POC.  ?  ?  ?OBJECTIVE IMPAIRMENTS Abnormal gait, decreased balance, decreased endurance, decreased mobility, difficulty walking, decreased ROM, decreased strength, and pain.  ?  ?  ACTIVITY LIMITATIONS cleaning, community activity, and occupation.  ?  ?PERSONAL FACTORS Fitness and 1-2 comorbidities: anxiety/depression  are also affecting patient's functional  outcome.  ?  ?  ?REHAB POTENTIAL: Excellent ?  ?CLINICAL DECISION MAKING: Stable/uncomplicated ?  ?EVALUATION COMPLEXITY: Low ?  ?  ?GOALS: ?Goals reviewed with patient? No ?  ?SHORT TERM GOALS: Target date: 10/04/2021 ?  ?Pt will be compliant and knowledgeable with initial HEP for improved comfort and carryover ?Baseline: initial HEP given ?Goal status: INITIAL ?  ?2.  Pt will self report R knee pain no greater than 6/10 for improved comfort and functional ability ?Baseline: 10/10 at worst ?Goal status: INITIAL ?  ?LONG TERM GOALS: Target date: 11/22/2021 ?  ?Pt will self report R knee pain no greater than 2/10 for improved comfort and functional ability ?Baseline: 10/10 at worst ?Goal status: INITIAL ?  ?2.  Pt will increase 30 Second Sit to Stand rep count to no less than 11 reps for improved balance, strength, and functional mobility ?Baseline: 8 reps with UE assist  ?Goal status: INITIAL ?  ?3.  Pt will increase LEFS functional score to no less than 45/80 for improved functional mobility and ADL performance ?Baseline: 23/80 ?Goal status: INITIAL ?  ?4.  Pt will be able to ascend/descend step ladder with no increase in R knee pain and reciprocal motion for improved functional ability with work related duties ?Baseline: unable ?Goal status: INITIAL ?  ?5.  Pt will be able to push/pull sled with 50# with no R knee pain to simulate job related activities at [employer] with improved tolerance  ?Baseline: unable ?Goal status: INITIAL ?  ?PLAN: ?PT FREQUENCY: 2x/week ?  ?PT DURATION: 10 weeks ?  ?PLANNED INTERVENTIONS: Therapeutic exercises, Therapeutic activity, Neuromuscular re-education, Balance training, Gait training, Patient/Family education, Joint mobilization, Electrical stimulation, Cryotherapy, Moist heat, Vasopneumatic device, and Manual therapy ?  ?PLAN FOR NEXT SESSION: assess HEP response; gait training, quad and hip strengthening  ? ? ? ?Eloy End, PT ?09/27/2021, 10:42 AM ? ?  ? ?

## 2021-09-28 NOTE — Therapy (Addendum)
?OUTPATIENT PHYSICAL THERAPY TREATMENT NOTE/DISCHARGE ? ?PHYSICAL THERAPY DISCHARGE SUMMARY ? ?Visits from Start of Care: 5 ? ?Current functional level related to goals / functional outcomes: ?Unable to assess ?  ?Remaining deficits: ?Unable to assess ?  ?Education / Equipment: ?N/A  ? ?Patient agrees to discharge. Patient goals were  Unable to assess . Patient is being discharged due to not returning since the last visit.  ? ?Patient Name: Gina Patel ?MRN: [id number] ?DOB:29-Oct-1975, 46 y.o.,, female ?Today's Date: 09/29/2021 ? ?PCP: Patient, No Pcp Per (Inactive) ?REFERRING PROVIDER: Frederico Hamman, MD ? ? PT End of Session - 09/29/21 1004   ? ? Visit Number 5   ? Number of Visits 20   ? Date for PT Re-Evaluation 11/22/21   ? Authorization Type [employer] MCD   ? PT Start Time 1003   ? PT Stop Time 1045   ? PT Time Calculation (min) 42 min   ? Activity Tolerance Patient tolerated treatment well   ? Behavior During Therapy [facility] for tasks assessed/performed   ? ?  ?  ? ?  ? ? ? ? ? ?Past Medical History:  ?Diagnosis Date  ? Anxiety   ? Arthritis   ? Depression   ? GERD (gastroesophageal reflux disease)   ? ?Past Surgical History:  ?Procedure Laterality Date  ? CHOLECYSTECTOMY    ? KNEE ARTHROSCOPY WITH LATERAL RELEASE Right 09/03/2021  ? Procedure: KNEE ARTHROSCOPY WITH LATERAL RELEASE, CHONDROPLASTY, DEBRIDEMENT;  Surgeon: Frederico Hamman, MD;  Location: [facility];  Service: Orthopedics;  Laterality: Right;  ? ?There are no problems to display for this patient. ? ? ?REFERRING DIAG: Post op right knee scope with Lateral release  3.10.23 ? ?THERAPY DIAG:  ?Right knee pain, unspecified chronicity ? ?Muscle weakness (generalized) ? ?Other abnormalities of gait and mobility ? ?Unsteadiness on feet ? ?PERTINENT HISTORY: Anxiety/Depression ? ?PRECAUTIONS: None ? ?ONSET DATE: 09/03/2021 ? ?WEIGHT BEARING RESTRICTIONS Yes WBAT ? ?SUBJECTIVE: Patient reports increased muscle soreness from session on Monday and reports she did a lot of  walking yesterday. ? ?PAIN:  ?Are you having pain?  Yes ?NPRS scale: unable to state, soreness/10 - (10/10 worst; 2/10 best) ?Pain location: R distal lateral quad ?Pain description: tight ?Aggravating factors: prolonged standing, walking ?Relieving factors: rest, ice ? ? ?  ?OBJECTIVE:  ?  ?DIAGNOSTIC FINDINGS:  ?          N/A ?  ?PATIENT SURVEYS:  ?LEFS 23/80 ?  ?COGNITION: ?          Overall cognitive status: Within functional limits for tasks assessed               ?           ?SENSATION: ?WFL ?  ?POSTURE:  ?Large body habitus, genu recurvatum ?  ?PALPATION: ?TTP to R lateral quad ?  ?LE ROM: ?  ?Active ROM Right ?09/13/2021 Left ?09/13/2021  ?Knee flexion 105 120  ?Knee extension -2 (hyperextension) -3 ?(hyperextension)  ? (Blank rows = not tested) ?  ?LE MMT: ?  ?MMT Right ?09/13/2021 Left ?09/13/2021  ?Hip flexion 5/5 5/5  ?Hip extension      ?Hip abduction 4/5 5/5  ?Hip adduction      ?Hip internal rotation      ?Hip external rotation      ?Knee flexion 4/5 5/5  ?Knee extension 4/5 5/5  ?Ankle dorsiflexion      ?Ankle plantarflexion      ?Ankle inversion      ?Ankle eversion      ? (  Blank rows = not tested) ?  ?LOWER EXTREMITY SPECIAL TESTS:  ?N/A ?  ?FUNCTIONAL TESTS:  ?30 Second Sit to Stand: 8 reps with UE support ?  ?GAIT: ?Distance walked: 5850ft ?Assistive device utilized: None ?Level of assistance: Complete Independence ?Comments: antalgic gait with decreased knee flexion R ?  ?TODAY'S TREATMENT: ?[facility] Adult PT Treatment:                                                DATE: 09/29/2021 ?Therapeutic Exercise: ?NuStep lvl 5 x 5 min LE only while taking subjective ?Step up 8in step x 10 fwd/lat ?Lateral walk RTB x 3 laps in // ?Standing hip abd/ext 2x10 each RTB in // bars ?Sled push/pull 60# 4x1120ft ?Heel taps with R on 2" step (cues to not hyperextend) 2x10 ?Heel raises 2x15 ?Slant board calf stretch 3x30" ? ? ?[facility] Adult PT Treatment:                                                DATE: 09/27/2021 ?Therapeutic  Exercise: ?NuStep lvl 5 x 5 min LE only while taking subjective ?Supine quad set x 10 - 5" hold ?SLR with QS 2x10 R ?Supine heel slide with strap x 10 - 5" hold R ?LAQ 2x10 2.5# R ?Standing TKE with ball 20 - 5" hold ?Step up 8in step x 10 fwd/lat ?Lateral walk RTB x 3 laps in // ?Standing hip abd/ext 2x10 each RTB ?Sled push/pull 60# 4x6220ft ? ?[facility]                                                DATE: 09/20/2021 ?Therapeutic Exercise: ?Nustep level 5 x 5 mins LE only while taking subjective ?Rolling out R quad x 2 min ?Supine quad set x 10 - 5" hold ?SAQ 2x10 R ?SLR with QS 2x10 R ?Standing TKE with ball 2x10 - 5" hold ?Step up 8in step 2x10 fwd/lat ?Lateral walk RTB x 3 laps in // ?Standing hamstring curl 3# 2x15  ?Standing hip abd 2x10 3# ?Sled push/pull 30# 5x7820ft ? ? ?PATIENT EDUCATION:  ?Education details: eval findings, LEFS, HEP, POC ?Person educated: Patient ?Education method: Explanation, Demonstration, and Handouts ?Education comprehension: verbalized understanding and returned demonstration ?  ?  ?HOME EXERCISE PROGRAM: ?Access Code: [email or web address] ?URL: [email or web address] ?Date: 09/13/2021 ?Prepared by: Edwinna Areolaavid Stroup ?  ?Exercises ?Supine Quadricep Sets - 2-3 x daily - 7 x weekly - 2 sets - 10 reps - 5 sec hold ?Active Straight Leg Raise with Quad Set - 2-3 x daily - 7 x weekly - 2 sets - 10 reps ?Supine Heel Slide with Strap - 2-3 x daily - 7 x weekly - 2 sets - 10 reps - 10 sec hold ?Sidelying Hip Abduction - 2 x daily - 7 x weekly - 2 sets - 10 reps ?  ?ASSESSMENT: ?  ?CLINICAL IMPRESSION: ?Patient presents to PT with reports of DOMS from session on Monday. She also states that she was able to go up and down a flight of stairs forwards and not sideways for the first time  since her surgery. Session today focused on improving quad strength and functional ability. Patient was able to tolerate all prescribed exercises with no adverse effects. Patient continues to benefit from  skilled PT services and should be progressed as able to improve functional independence. ? ?  ?  ?OBJECTIVE IMPAIRMENTS Abnormal gait, decreased balance, decreased endurance, decreased mobility, difficulty walking, decreased ROM, decreased strength, and pain.  ?  ?ACTIVITY LIMITATIONS cleaning, community activity, and occupation.  ?  ?PERSONAL FACTORS Fitness and 1-2 comorbidities: anxiety/depression  are also affecting patient's functional outcome.  ?  ?  ?REHAB POTENTIAL: Excellent ?  ?CLINICAL DECISION MAKING: Stable/uncomplicated ?  ?EVALUATION COMPLEXITY: Low ?  ?  ?GOALS: ?Goals reviewed with patient? No ?  ?SHORT TERM GOALS: Target date: 10/04/2021 ?  ?Pt will be compliant and knowledgeable with initial HEP for improved comfort and carryover ?Baseline: initial HEP given ?Goal status: INITIAL ?  ?2.  Pt will self report R knee pain no greater than 6/10 for improved comfort and functional ability ?Baseline: 10/10 at worst ?Goal status: INITIAL ?  ?LONG TERM GOALS: Target date: 11/22/2021 ?  ?Pt will self report R knee pain no greater than 2/10 for improved comfort and functional ability ?Baseline: 10/10 at worst ?Goal status: INITIAL ?  ?2.  Pt will increase 30 Second Sit to Stand rep count to no less than 11 reps for improved balance, strength, and functional mobility ?Baseline: 8 reps with UE assist  ?Goal status: INITIAL ?  ?3.  Pt will increase LEFS functional score to no less than 45/80 for improved functional mobility and ADL performance ?Baseline: 23/80 ?Goal status: INITIAL ?  ?4.  Pt will be able to ascend/descend step ladder with no increase in R knee pain and reciprocal motion for improved functional ability with work related duties ?Baseline: unable ?Goal status: INITIAL ?  ?5.  Pt will be able to push/pull sled with 50# with no R knee pain to simulate job related activities at [employer] with improved tolerance  ?Baseline: unable ?Goal status: INITIAL ?  ?PLAN: ?PT FREQUENCY: 2x/week ?  ?PT DURATION:  10 weeks ?  ?PLANNED INTERVENTIONS: Therapeutic exercises, Therapeutic activity, Neuromuscular re-education, Balance training, Gait training, Patient/Family education, Joint mobilization, Electrical st

## 2021-09-29 ENCOUNTER — Ambulatory Visit: Payer: Medicaid Other

## 2021-09-29 DIAGNOSIS — M25561 Pain in right knee: Secondary | ICD-10-CM

## 2021-09-29 DIAGNOSIS — R2689 Other abnormalities of gait and mobility: Secondary | ICD-10-CM

## 2021-09-29 DIAGNOSIS — R2681 Unsteadiness on feet: Secondary | ICD-10-CM

## 2021-09-29 DIAGNOSIS — M6281 Muscle weakness (generalized): Secondary | ICD-10-CM

## 2021-10-11 ENCOUNTER — Telehealth: Payer: Self-pay

## 2021-10-11 ENCOUNTER — Ambulatory Visit: Payer: Medicaid Other

## 2021-10-11 NOTE — Therapy (Incomplete)
?OUTPATIENT PHYSICAL THERAPY TREATMENT NOTE ? ? ?Patient Name: Gina Patel ?MRN: [id number] ?DOB:06-Jun-1976, 46 y.o., female ?Today's Date: 10/11/2021 ? ?PCP: Patient, No Pcp Per (Inactive) ?REFERRING PROVIDER: Frederico Hamman, MD ? ? ? ? ? ? ? ?Past Medical History:  ?Diagnosis Date  ? Anxiety   ? Arthritis   ? Depression   ? GERD (gastroesophageal reflux disease)   ? ?Past Surgical History:  ?Procedure Laterality Date  ? CHOLECYSTECTOMY    ? KNEE ARTHROSCOPY WITH LATERAL RELEASE Right 09/03/2021  ? Procedure: KNEE ARTHROSCOPY WITH LATERAL RELEASE, CHONDROPLASTY, DEBRIDEMENT;  Surgeon: Frederico Hamman, MD;  Location: [facility];  Service: Orthopedics;  Laterality: Right;  ? ?There are no problems to display for this patient. ? ? ?REFERRING DIAG: Post op right knee scope with Lateral release  3.10.23 ? ?THERAPY DIAG:  ?No diagnosis found. ? ?PERTINENT HISTORY: Anxiety/Depression ? ?PRECAUTIONS: None ? ?ONSET DATE: 09/03/2021 ? ?WEIGHT BEARING RESTRICTIONS Yes WBAT ? ?SUBJECTIVE:  ?*** ? ?PAIN:  ?Are you having pain?  Yes ?NPRS scale: unable to state, soreness/10 - (10/10 worst; 2/10 best) ?Pain location: R distal lateral quad ?Pain description: tight ?Aggravating factors: prolonged standing, walking ?Relieving factors: rest, ice ? ? ?  ?OBJECTIVE:  ?  ?DIAGNOSTIC FINDINGS:  ?          N/A ?  ?PATIENT SURVEYS:  ?LEFS 23/80 ?  ?COGNITION: ?          Overall cognitive status: Within functional limits for tasks assessed               ?           ?SENSATION: ?WFL ?  ?POSTURE:  ?Large body habitus, genu recurvatum ?  ?PALPATION: ?TTP to R lateral quad ?  ?LE ROM: ?  ?Active ROM Right ?09/13/2021 Left ?09/13/2021  ?Knee flexion 105 120  ?Knee extension -2 (hyperextension) -3 ?(hyperextension)  ? (Blank rows = not tested) ?  ?LE MMT: ?  ?MMT Right ?09/13/2021 Left ?09/13/2021  ?Hip flexion 5/5 5/5  ?Hip extension      ?Hip abduction 4/5 5/5  ?Hip adduction      ?Hip internal rotation      ?Hip external rotation      ?Knee flexion  4/5 5/5  ?Knee extension 4/5 5/5  ?Ankle dorsiflexion      ?Ankle plantarflexion      ?Ankle inversion      ?Ankle eversion      ? (Blank rows = not tested) ?  ?LOWER EXTREMITY SPECIAL TESTS:  ?N/A ?  ?FUNCTIONAL TESTS:  ?30 Second Sit to Stand: 8 reps with UE support ?  ?GAIT: ?Distance walked: 37ft ?Assistive device utilized: None ?Level of assistance: Complete Independence ?Comments: antalgic gait with decreased knee flexion R ?  ?TODAY'S TREATMENT: ?[facility] Adult PT Treatment:                                                DATE: 10/11/2021 ?Therapeutic Exercise: ?NuStep lvl 5 x 5 min LE only while taking subjective ?Supine quad set x 10 - 5" hold ?SLR with QS 2x10 R ?Supine heel slide with strap x 10 - 5" hold R ?LAQ 2x10 2.5# R ?Standing TKE with ball 20 - 5" hold ?Step up 8in step x 10 fwd/lat ?Lateral walk RTB x 3 laps in // ?Standing hip abd/ext 2x10 each RTB ?Sled  push/pull 60# 4x46ft ? ?[facility] Adult PT Treatment:                                                DATE: 09/29/2021 ?Therapeutic Exercise: ?NuStep lvl 5 x 5 min LE only while taking subjective ?Step up 8in step x 10 fwd/lat ?Lateral walk RTB x 3 laps in // ?Standing hip abd/ext 2x10 each RTB in // bars ?Sled push/pull 60# 4x50ft ?Heel taps with R on 2" step (cues to not hyperextend) 2x10 ?Heel raises 2x15 ?Slant board calf stretch 3x30" ? ? ?[facility] Adult PT Treatment:                                                DATE: 09/27/2021 ?Therapeutic Exercise: ?NuStep lvl 5 x 5 min LE only while taking subjective ?Supine quad set x 10 - 5" hold ?SLR with QS 2x10 R ?Supine heel slide with strap x 10 - 5" hold R ?LAQ 2x10 2.5# R ?Standing TKE with ball 20 - 5" hold ?Step up 8in step x 10 fwd/lat ?Lateral walk RTB x 3 laps in // ?Standing hip abd/ext 2x10 each RTB ?Sled push/pull 60# 4x55ft ? ?PATIENT EDUCATION:  ?Education details: eval findings, LEFS, HEP, POC ?Person educated: Patient ?Education method: Explanation, Demonstration, and Handouts ?Education  comprehension: verbalized understanding and returned demonstration ?  ?  ?HOME EXERCISE PROGRAM: ?Access Code: [email or web address] ?URL: [email or web address] ?Date: 09/13/2021 ?Prepared by: Edwinna Areola ?  ?Exercises ?Supine Quadricep Sets - 2-3 x daily - 7 x weekly - 2 sets - 10 reps - 5 sec hold ?Active Straight Leg Raise with Quad Set - 2-3 x daily - 7 x weekly - 2 sets - 10 reps ?Supine Heel Slide with Strap - 2-3 x daily - 7 x weekly - 2 sets - 10 reps - 10 sec hold ?Sidelying Hip Abduction - 2 x daily - 7 x weekly - 2 sets - 10 reps ?  ?ASSESSMENT: ?  ?CLINICAL IMPRESSION: ?*** ? ?  ?  ?OBJECTIVE IMPAIRMENTS Abnormal gait, decreased balance, decreased endurance, decreased mobility, difficulty walking, decreased ROM, decreased strength, and pain.  ?  ?ACTIVITY LIMITATIONS cleaning, community activity, and occupation.  ?  ?PERSONAL FACTORS Fitness and 1-2 comorbidities: anxiety/depression  are also affecting patient's functional outcome.  ? ?  ?GOALS: ?Goals reviewed with patient? No ?  ?SHORT TERM GOALS: Target date: 10/04/2021 ?  ?Pt will be compliant and knowledgeable with initial HEP for improved comfort and carryover ?Baseline: initial HEP given ?Goal status: INITIAL ?  ?2.  Pt will self report R knee pain no greater than 6/10 for improved comfort and functional ability ?Baseline: 10/10 at worst ?Goal status: INITIAL ?  ?LONG TERM GOALS: Target date: 11/22/2021 ?  ?Pt will self report R knee pain no greater than 2/10 for improved comfort and functional ability ?Baseline: 10/10 at worst ?Goal status: INITIAL ?  ?2.  Pt will increase 30 Second Sit to Stand rep count to no less than 11 reps for improved balance, strength, and functional mobility ?Baseline: 8 reps with UE assist  ?Goal status: INITIAL ?  ?3.  Pt will increase LEFS functional score to no less than 45/80 for improved functional mobility and ADL performance ?Baseline: 23/80 ?  Goal status: INITIAL ?  ?4.  Pt will be able to ascend/descend step  ladder with no increase in R knee pain and reciprocal motion for improved functional ability with work related duties ?Baseline: unable ?Goal status: INITIAL ?  ?5.  Pt will be able to push/pull sled with 50# with no R knee pain to simulate job related activities at [employer] with improved tolerance  ?Baseline: unable ?Goal status: INITIAL ?  ?PLAN: ?PT FREQUENCY: 2x/week ?  ?PT DURATION: 10 weeks ?  ?PLANNED INTERVENTIONS: Therapeutic exercises, Therapeutic activity, Neuromuscular re-education, Balance training, Gait training, Patient/Family education, Joint mobilization, Electrical stimulation, Cryotherapy, Moist heat, Vasopneumatic device, and Manual therapy ?  ?PLAN FOR NEXT SESSION: assess HEP response; gait training, quad and hip strengthening  ? ? ? ?Eloy Endavid C Jayron Maqueda, PT ?10/11/2021, 7:45 AM ? ?  ? ?

## 2021-10-11 NOTE — Telephone Encounter (Signed)
PT called and left voicemail regarding  missed visit. ? ?Left reminder of next appointment and of attendance policy. ? ?Ward Chatters, PT ?10/11/21 11:25 AM ? ?

## 2021-10-12 NOTE — Therapy (Incomplete)
?OUTPATIENT PHYSICAL THERAPY TREATMENT NOTE ? ? ?Patient Name: Gina Patel ?MRN: [id number] ?DOB:Nov 07, 1975, 46 y.o., female ?Today's Date: 10/12/2021 ? ?PCP: Patient, No Pcp Per (Inactive) ?REFERRING PROVIDER: Earlie Server, MD ? ? ? ? ? ? ? ?Past Medical History:  ?Diagnosis Date  ? Anxiety   ? Arthritis   ? Depression   ? GERD (gastroesophageal reflux disease)   ? ?Past Surgical History:  ?Procedure Laterality Date  ? CHOLECYSTECTOMY    ? KNEE ARTHROSCOPY WITH LATERAL RELEASE Right 09/03/2021  ? Procedure: KNEE ARTHROSCOPY WITH LATERAL RELEASE, CHONDROPLASTY, DEBRIDEMENT;  Surgeon: Earlie Server, MD;  Location: [facility];  Service: Orthopedics;  Laterality: Right;  ? ?There are no problems to display for this patient. ? ? ?REFERRING DIAG: Post op right knee scope with Lateral release  3.10.23 ? ?THERAPY DIAG:  ?No diagnosis found. ? ?PERTINENT HISTORY: Anxiety/Depression ? ?PRECAUTIONS: None ? ?ONSET DATE: 09/03/2021 ? ?WEIGHT BEARING RESTRICTIONS Yes WBAT ? ?SUBJECTIVE: *** ?Patient reports increased muscle soreness from session on Monday and reports she did a lot of walking yesterday. ? ?PAIN:  ?Are you having pain?  Yes ?NPRS scale: ***/10 - (10/10 worst; 2/10 best) ?Pain location: R distal lateral quad ?Pain description: tight ?Aggravating factors: prolonged standing, walking ?Relieving factors: rest, ice ? ? ?  ?OBJECTIVE:  ?  ?DIAGNOSTIC FINDINGS:  ?          N/A ?  ?PATIENT SURVEYS:  ?LEFS 23/80 ?  ?COGNITION: ?          Overall cognitive status: Within functional limits for tasks assessed               ?           ?SENSATION: ?WFL ?  ?POSTURE:  ?Large body habitus, genu recurvatum ?  ?PALPATION: ?TTP to R lateral quad ?  ?LE ROM: ?  ?Active ROM Right ?09/13/2021 Left ?09/13/2021  ?Knee flexion 105 120  ?Knee extension -2 (hyperextension) -3 ?(hyperextension)  ? (Blank rows = not tested) ?  ?LE MMT: ?  ?MMT Right ?09/13/2021 Left ?09/13/2021  ?Hip flexion 5/5 5/5  ?Hip extension      ?Hip abduction 4/5 5/5   ?Hip adduction      ?Hip internal rotation      ?Hip external rotation      ?Knee flexion 4/5 5/5  ?Knee extension 4/5 5/5  ?Ankle dorsiflexion      ?Ankle plantarflexion      ?Ankle inversion      ?Ankle eversion      ? (Blank rows = not tested) ?  ?LOWER EXTREMITY SPECIAL TESTS:  ?N/A ?  ?FUNCTIONAL TESTS:  ?30 Second Sit to Stand: 8 reps with UE support ?10/12/2021 *** reps ?  ?GAIT: ?Distance walked: 8ft ?Assistive device utilized: None ?Level of assistance: Complete Independence ?Comments: antalgic gait with decreased knee flexion R ?  ?TODAY'S TREATMENT: ?[facility] Adult PT Treatment:                                                DATE: 10/12/2021 ?Therapeutic Exercise: ?NuStep lvl 5 x 5 min LE only while taking subjective ?30" STS *** reps ?Step up 8in step x 10 fwd/lat ?Lateral walk RTB x 3 laps in // ?Standing hip abd/ext 2x10 each RTB in // bars ?Sled push/pull 60# 4x23ft ?Heel taps with R on 2" step (cues to  not hyperextend) 2x10 ?Heel raises 2x15 ?Slant board calf stretch 3x30" ?Hip flexion to knee extension 2x10 BIL ? ? ?[facility] Adult PT Treatment:                                                DATE: 09/29/2021 ?Therapeutic Exercise: ?NuStep lvl 5 x 5 min LE only while taking subjective ?Step up 8in step x 10 fwd/lat ?Lateral walk RTB x 3 laps in // ?Standing hip abd/ext 2x10 each RTB in // bars ?Sled push/pull 60# 4x59ft ?Heel taps with R on 2" step (cues to not hyperextend) 2x10 ?Heel raises 2x15 ?Slant board calf stretch 3x30" ? ? ?[facility] PT Treatment:                                                DATE: 09/27/2021 ?Therapeutic Exercise: ?NuStep lvl 5 x 5 min LE only while taking subjective ?Supine quad set x 10 - 5" hold ?SLR with QS 2x10 R ?Supine heel slide with strap x 10 - 5" hold R ?LAQ 2x10 2.5# R ?Standing TKE with ball 20 - 5" hold ?Step up 8in step x 10 fwd/lat ?Lateral walk RTB x 3 laps in // ?Standing hip abd/ext 2x10 each RTB ?Sled push/pull 60# 4x28ft ? ?[facility] Adult PT Treatment:                                                 DATE: 09/20/2021 ?Therapeutic Exercise: ?Nustep level 5 x 5 mins LE only while taking subjective ?Rolling out R quad x 2 min ?Supine quad set x 10 - 5" hold ?SAQ 2x10 R ?SLR with QS 2x10 R ?Standing TKE with ball 2x10 - 5" hold ?Step up 8in step 2x10 fwd/lat ?Lateral walk RTB x 3 laps in // ?Standing hamstring curl 3# 2x15  ?Standing hip abd 2x10 3# ?Sled push/pull 30# 5x68ft ? ? ?PATIENT EDUCATION:  ?Education details: eval findings, LEFS, HEP, POC ?Person educated: Patient ?Education method: Explanation, Demonstration, and Handouts ?Education comprehension: verbalized understanding and returned demonstration ?  ?  ?HOME EXERCISE PROGRAM: ?Access Code: [email or web address] ?URL: [email or web address] ?Date: 09/13/2021 ?Prepared by: Octavio Manns ?  ?Exercises ?Supine Quadricep Sets - 2-3 x daily - 7 x weekly - 2 sets - 10 reps - 5 sec hold ?Active Straight Leg Raise with Quad Set - 2-3 x daily - 7 x weekly - 2 sets - 10 reps ?Supine Heel Slide with Strap - 2-3 x daily - 7 x weekly - 2 sets - 10 reps - 10 sec hold ?Sidelying Hip Abduction - 2 x daily - 7 x weekly - 2 sets - 10 reps ?  ?ASSESSMENT: ?  ?CLINICAL IMPRESSION: ?*** ? ?Patient presents to PT with reports of DOMS from session on Monday. She also states that she was able to go up and down a flight of stairs forwards and not sideways for the first time since her surgery. Session today focused on improving quad strength and functional ability. Patient was able to tolerate all prescribed exercises with no adverse effects. Patient continues to benefit from skilled PT services  and should be progressed as able to improve functional independence. ? ?  ?  ?OBJECTIVE IMPAIRMENTS Abnormal gait, decreased balance, decreased endurance, decreased mobility, difficulty walking, decreased ROM, decreased strength, and pain.  ?  ?ACTIVITY LIMITATIONS cleaning, community activity, and occupation.  ?  ?PERSONAL FACTORS Fitness and 1-2  comorbidities: anxiety/depression  are also affecting patient's functional outcome.  ?  ?  ?REHAB POTENTIAL: Excellent ?  ?CLINICAL DECISION MAKING: Stable/uncomplicated ?  ?EVALUATION COMPLEXITY: Low ?  ?  ?GOALS: ?Goals reviewed with patient? No ?  ?SHORT TERM GOALS: Target date: 10/04/2021 ?  ?Pt will be compliant and knowledgeable with initial HEP for improved comfort and carryover ?Baseline: initial HEP given ?Goal status: INITIAL ?  ?2.  Pt will self report R knee pain no greater than 6/10 for improved comfort and functional ability ?Baseline: 10/10 at worst ?Goal status: INITIAL ?  ?LONG TERM GOALS: Target date: 11/22/2021 ?  ?Pt will self report R knee pain no greater than 2/10 for improved comfort and functional ability ?Baseline: 10/10 at worst ?Goal status: INITIAL ?  ?2.  Pt will increase 30 Second Sit to Stand rep count to no less than 11 reps for improved balance, strength, and functional mobility ?Baseline: 8 reps with UE assist  ?Goal status: INITIAL ?  ?3.  Pt will increase LEFS functional score to no less than 45/80 for improved functional mobility and ADL performance ?Baseline: 23/80 ?Goal status: INITIAL ?  ?4.  Pt will be able to ascend/descend step ladder with no increase in R knee pain and reciprocal motion for improved functional ability with work related duties ?Baseline: unable ?Goal status: INITIAL ?  ?5.  Pt will be able to push/pull sled with 50# with no R knee pain to simulate job related activities at [place] with improved tolerance  ?Baseline: unable ?Goal status: INITIAL ?  ?PLAN: ?PT FREQUENCY: 2x/week ?  ?PT DURATION: 10 weeks ?  ?PLANNED INTERVENTIONS: Therapeutic exercises, Therapeutic activity, Neuromuscular re-education, Balance training, Gait training, Patient/Family education, Joint mobilization, Electrical stimulation, Cryotherapy, Moist heat, Vasopneumatic device, and Manual therapy ?  ?PLAN FOR NEXT SESSION: assess HEP response; gait training, quad and hip strengthening   ? ? ? ?Evelene Croon, PTA ?10/12/2021, 5:57 PM ? ?  ? ?

## 2021-10-13 ENCOUNTER — Ambulatory Visit: Payer: Medicaid Other

## 2022-02-17 IMAGING — CT CT HEAD W/O CM
4 series · 17 of 47 positions shown, 19 images · non-contrast
Comparison: None.

CLINICAL DATA: Three days of headache, hypertension

EXAM:
CT HEAD WITHOUT CONTRAST
TECHNIQUE: Contiguous axial images were obtained from the base of the skull
through the vertex without intravenous contrast.

[Series 3: head wo · axial · 0.39mm/px · z∈[-132,-12]mm · 7 of 32 slices shown, 9 images]
[im 4/32  brain]
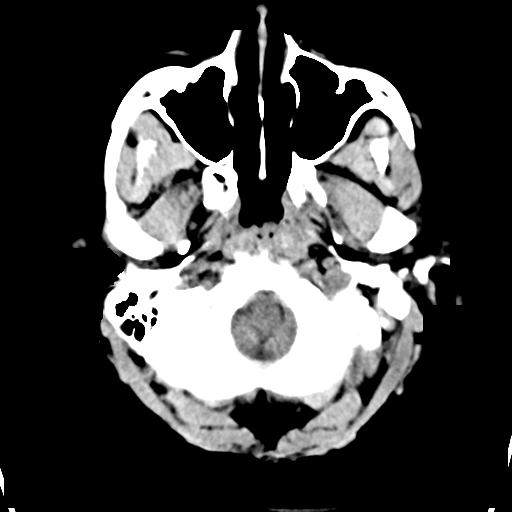
[im 4/32  bone]
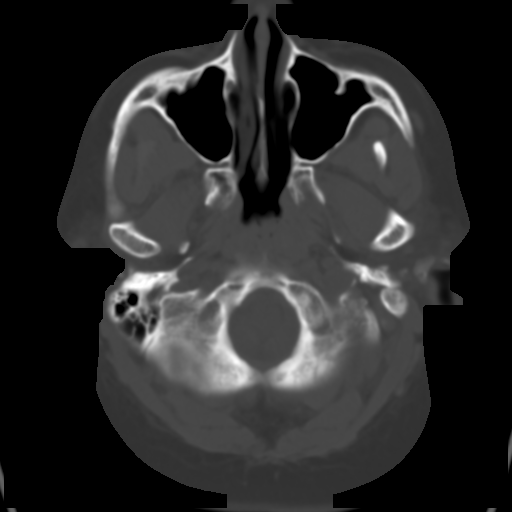
[im 8/32  brain]
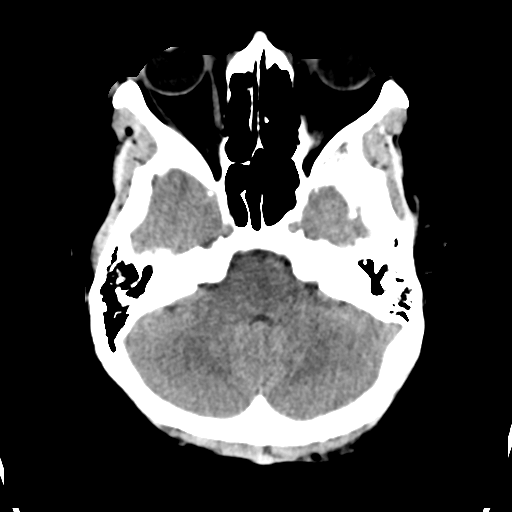
[im 12/32  brain]
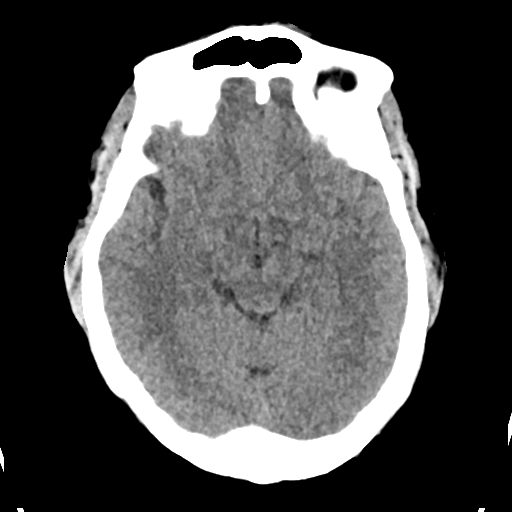
[im 16/32  brain]
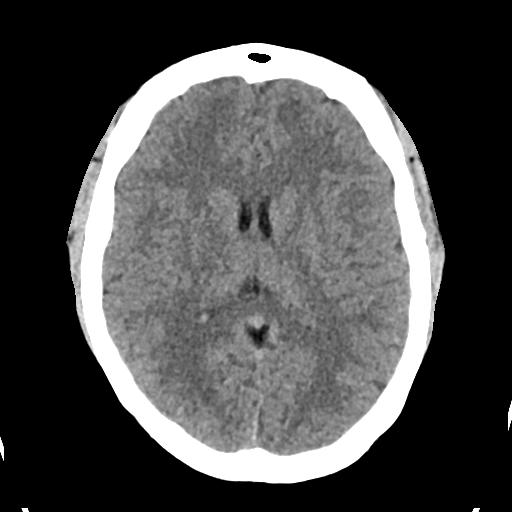
[im 20/32  brain]
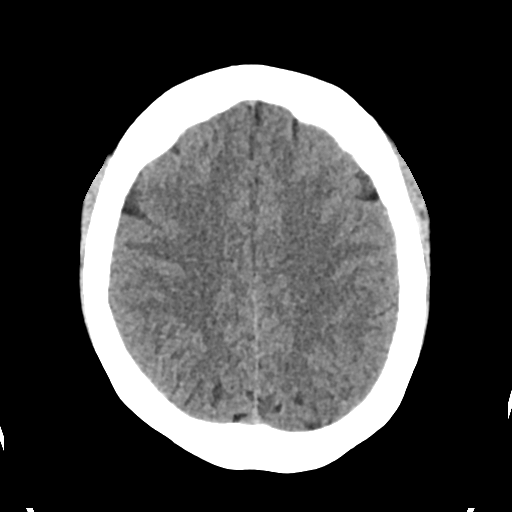
[im 20/32  bone]
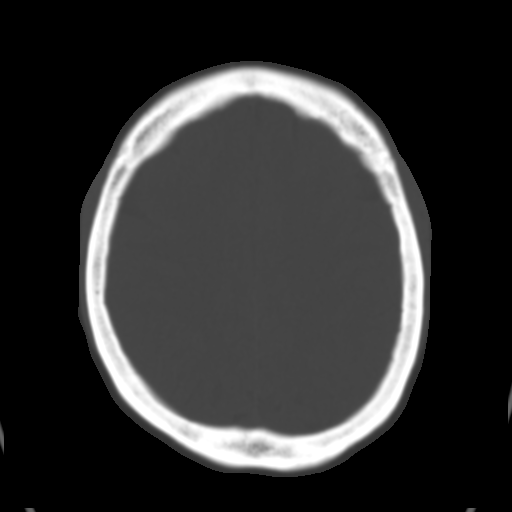
[im 24/32  brain]
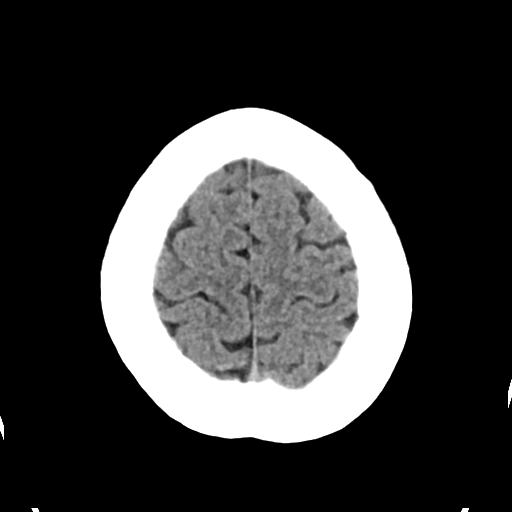
[im 28/32  brain]
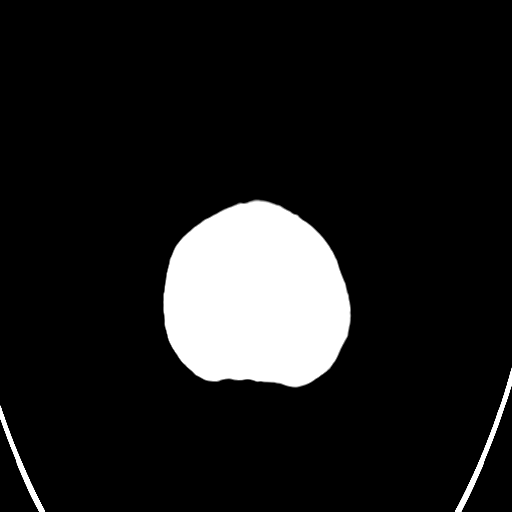

[Series 4: head bone · axial · 0.39mm/px · z∈[-133,-77]mm · 4 of 79 slices shown]
[im 8/79  bone]
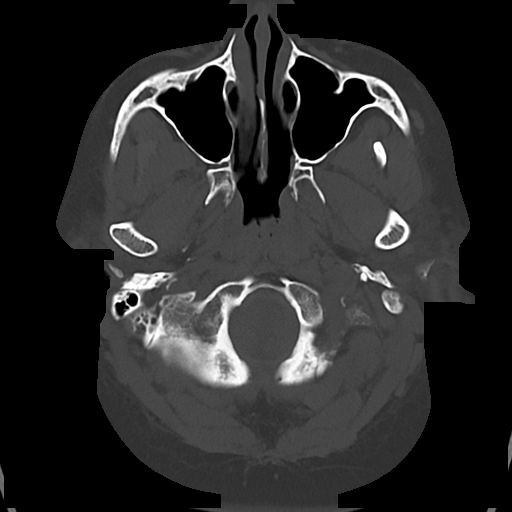
[im 16/79  bone]
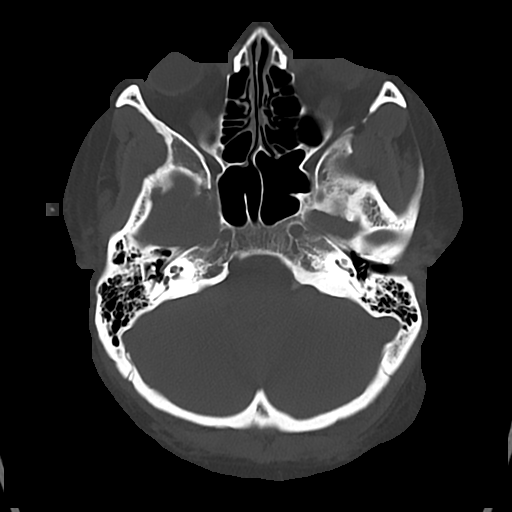
[im 24/79  bone]
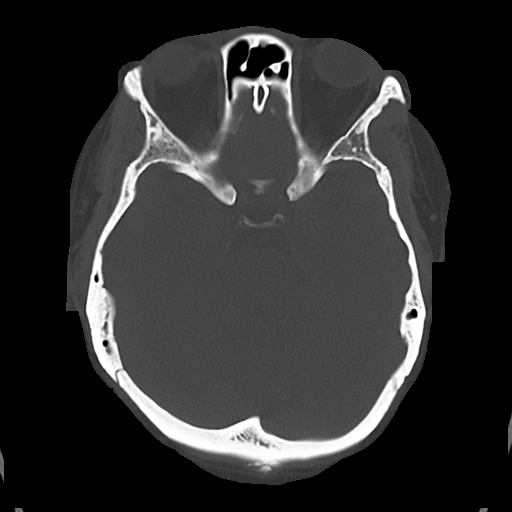
[im 36/79  bone]
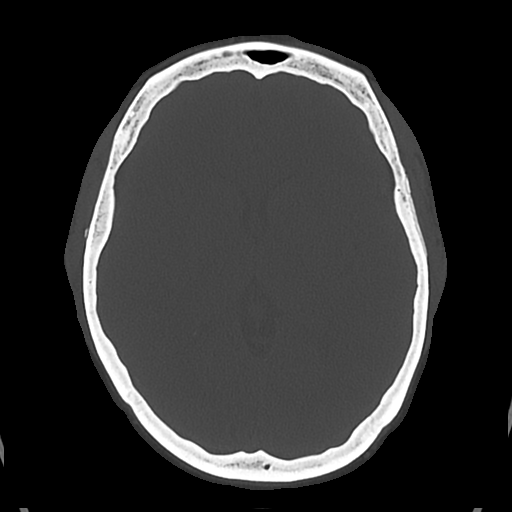

[Series 5: cor soft · coronal · 0.32mm/px · 3 of 69 slices shown]
[im 23/69  brain]
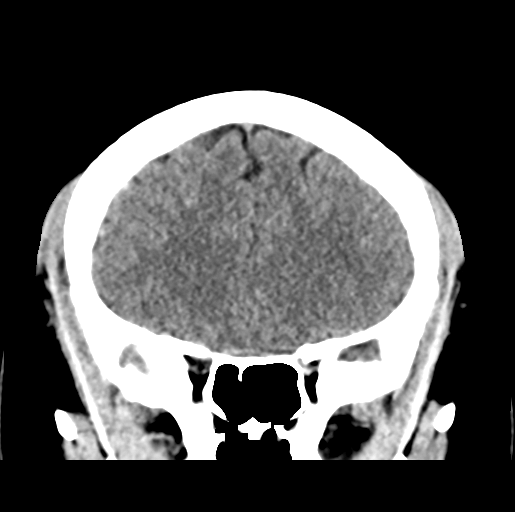
[im 31/69  brain]
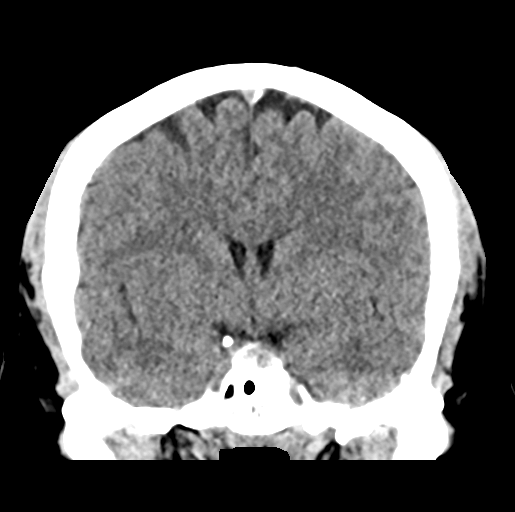
[im 38/69  brain]
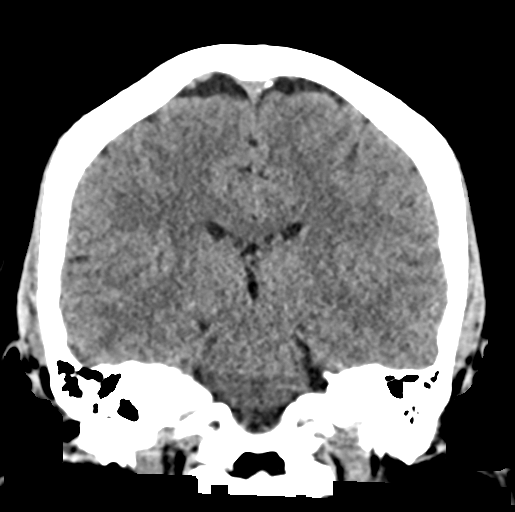

[Series 6: sag soft · sagittal · 0.30mm/px · 3 of 60 slices shown]
[im 20/60  brain]
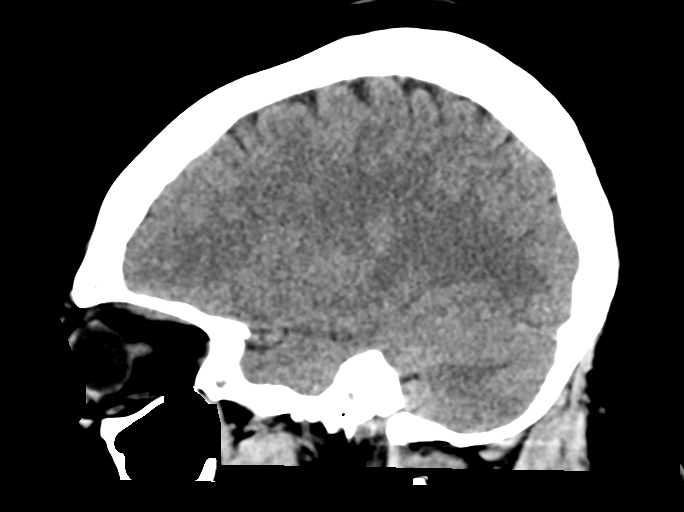
[im 30/60  brain]
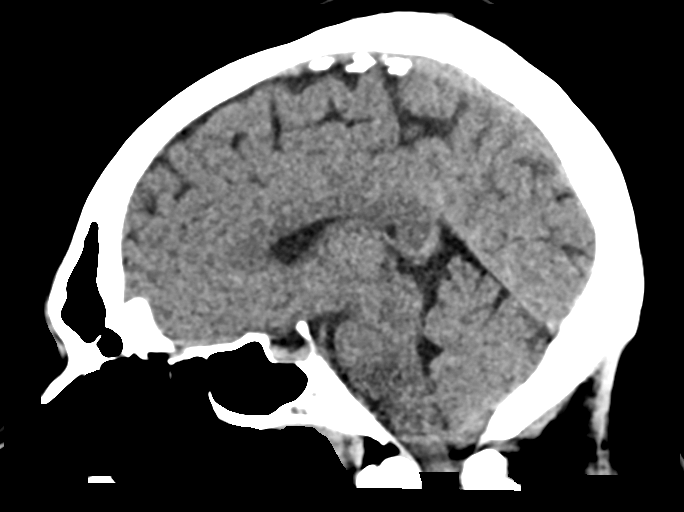
[im 40/60  brain]
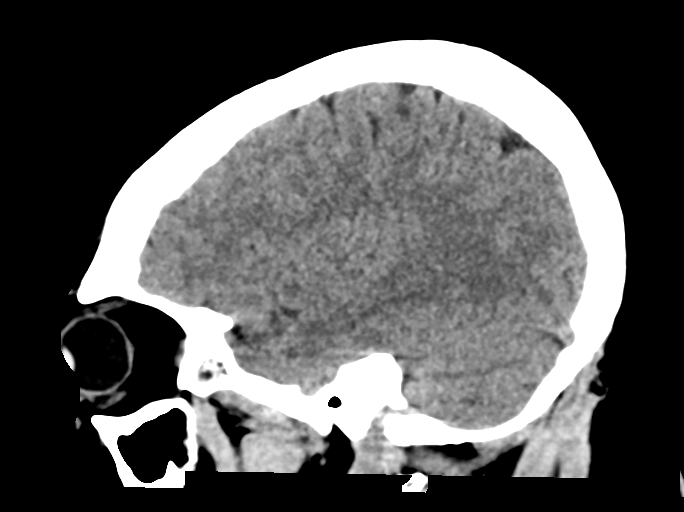

[17 of 47 positions shown; findings below may reference images not displayed]

FINDINGS: Brain: No evidence of acute infarction, hemorrhage, hydrocephalus,
extra-axial collection, visible mass lesion or mass effect. Basilar
cisterns are patent. Cerebellar tonsils are normally positioned.

Vascular: No hyperdense vessel or unexpected calcification.

Skull: No calvarial fracture or suspicious osseous lesion. No scalp
swelling or hematoma.

Sinuses/Orbits: Paranasal sinuses and mastoid air cells are
predominantly clear. Included orbital structures are unremarkable.

Other: None.
IMPRESSION: No acute intracranial findings.
